# Patient Record
Sex: Female | Born: 1985
Health system: Southern US, Community
[De-identification: ages and names within clinical notes are randomized; demographics above are authoritative.]

## PROBLEM LIST (undated history)

## (undated) DIAGNOSIS — D649 Anemia, unspecified: Secondary | ICD-10-CM

## (undated) DIAGNOSIS — R569 Unspecified convulsions: Secondary | ICD-10-CM

## (undated) DIAGNOSIS — Z5189 Encounter for other specified aftercare: Secondary | ICD-10-CM

---
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
## 2007-05-09 ENCOUNTER — Emergency Department (HOSPITAL_COMMUNITY): Admission: EM | Admit: 2007-05-09 | Discharge: 2007-05-09 | Payer: Self-pay | Admitting: Emergency Medicine

## 2007-06-02 ENCOUNTER — Ambulatory Visit (HOSPITAL_COMMUNITY): Admission: RE | Admit: 2007-06-02 | Discharge: 2007-06-02 | Payer: Self-pay | Admitting: Family Medicine

## 2007-07-18 ENCOUNTER — Other Ambulatory Visit: Admission: RE | Admit: 2007-07-18 | Discharge: 2007-07-18 | Payer: Self-pay | Admitting: Obstetrics and Gynecology

## 2007-07-27 ENCOUNTER — Emergency Department (HOSPITAL_COMMUNITY): Admission: EM | Admit: 2007-07-27 | Discharge: 2007-07-28 | Payer: Self-pay | Admitting: Emergency Medicine

## 2007-07-31 ENCOUNTER — Ambulatory Visit (HOSPITAL_COMMUNITY): Admission: RE | Admit: 2007-07-31 | Discharge: 2007-07-31 | Payer: Self-pay | Admitting: Family Medicine

## 2007-10-16 ENCOUNTER — Other Ambulatory Visit: Admission: RE | Admit: 2007-10-16 | Discharge: 2007-10-16 | Payer: Self-pay | Admitting: Obstetrics and Gynecology

## 2008-05-05 ENCOUNTER — Emergency Department (HOSPITAL_COMMUNITY): Admission: EM | Admit: 2008-05-05 | Discharge: 2008-05-05 | Payer: Self-pay | Admitting: Emergency Medicine

## 2008-08-31 ENCOUNTER — Ambulatory Visit: Payer: Self-pay | Admitting: Cardiology

## 2008-08-31 ENCOUNTER — Ambulatory Visit: Payer: Self-pay | Admitting: Advanced Practice Midwife

## 2008-08-31 ENCOUNTER — Inpatient Hospital Stay (HOSPITAL_COMMUNITY): Admission: AD | Admit: 2008-08-31 | Discharge: 2008-08-31 | Payer: Self-pay | Admitting: Obstetrics & Gynecology

## 2008-08-31 ENCOUNTER — Inpatient Hospital Stay (HOSPITAL_COMMUNITY): Admission: AD | Admit: 2008-08-31 | Discharge: 2008-09-12 | Payer: Self-pay | Admitting: Obstetrics & Gynecology

## 2008-09-07 ENCOUNTER — Encounter: Payer: Self-pay | Admitting: Cardiology

## 2009-01-03 ENCOUNTER — Other Ambulatory Visit: Admission: RE | Admit: 2009-01-03 | Discharge: 2009-01-03 | Payer: Self-pay | Admitting: Obstetrics & Gynecology

## 2010-02-03 IMAGING — CR DG ABDOMEN ACUTE W/ 1V CHEST
3 series · 3 of 3 positions shown · non-contrast
Comparison: None

CLINICAL DATA: Postpartum.  Evaluate for ileus.

ACUTE ABDOMEN SERIES (ABDOMEN 2 VIEW & CHEST 1 VIEW)

[view not recorded (1 of 3)]
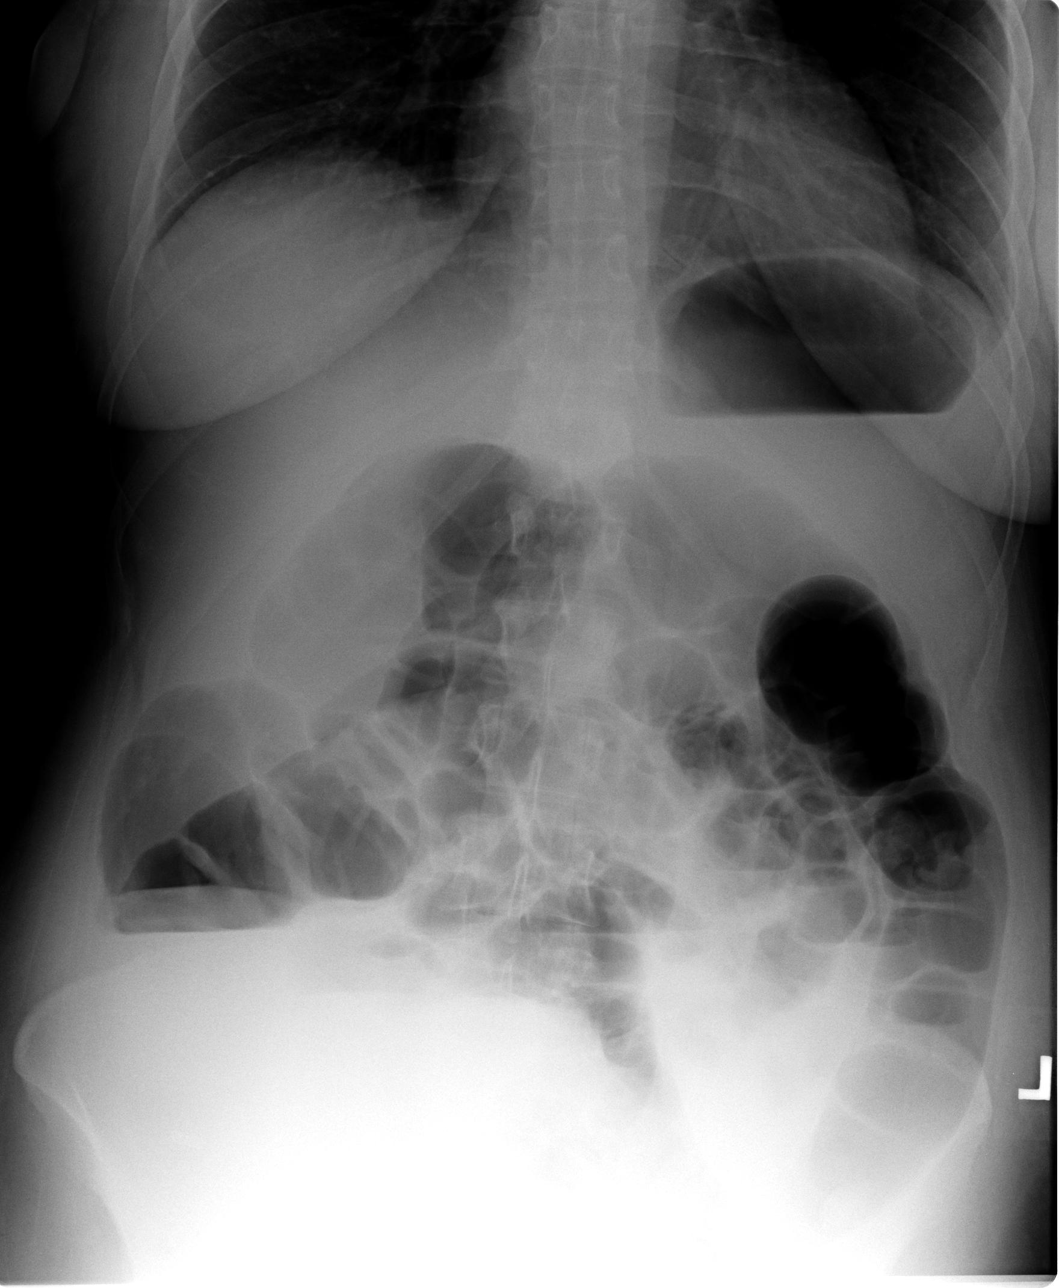

[view not recorded (2 of 3)]
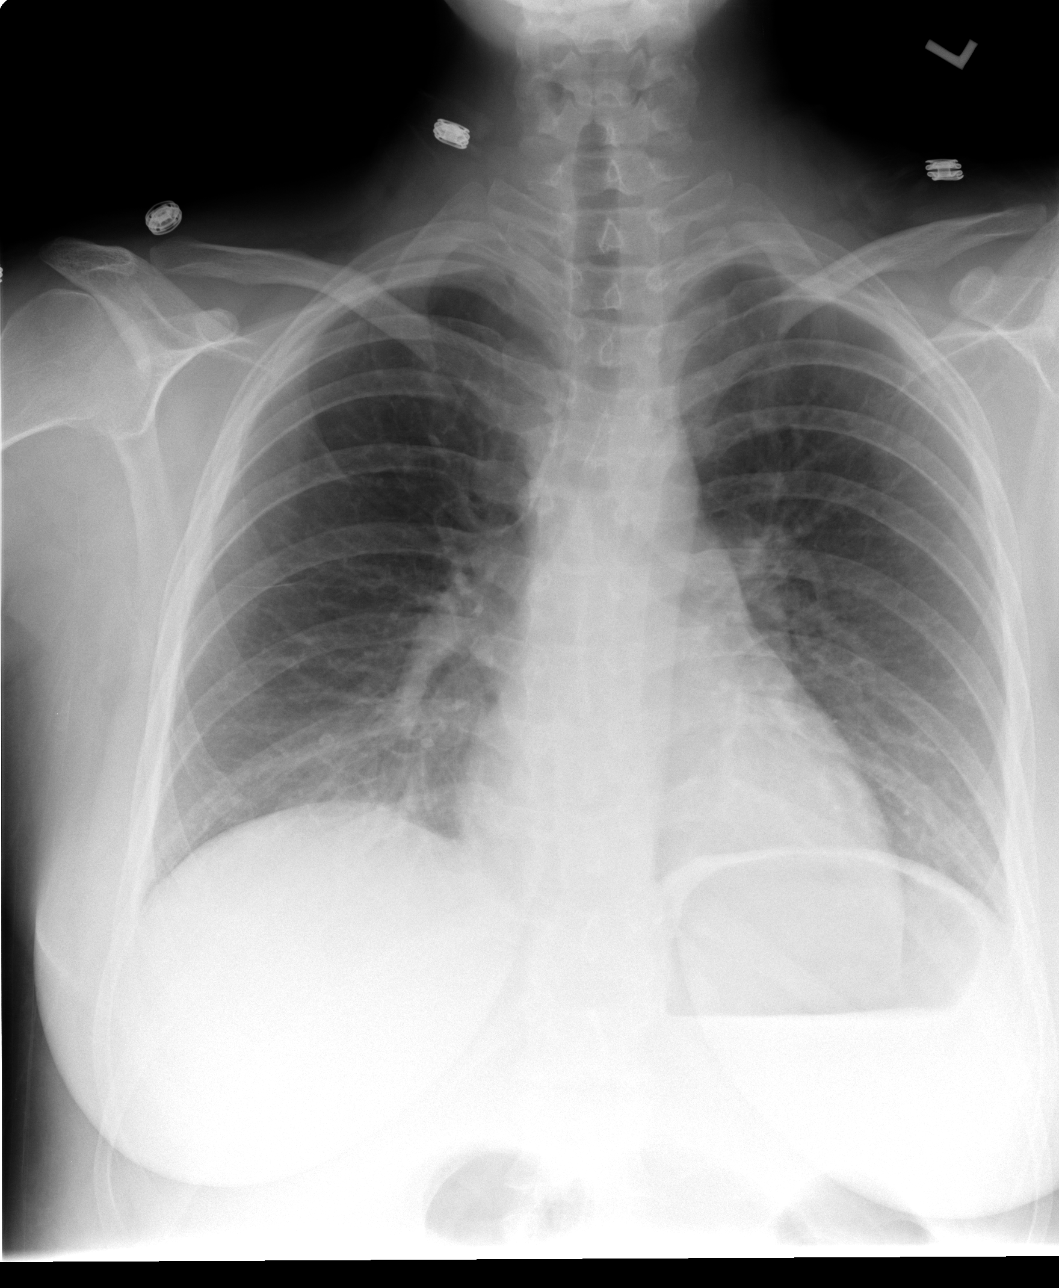

[view not recorded (3 of 3)]
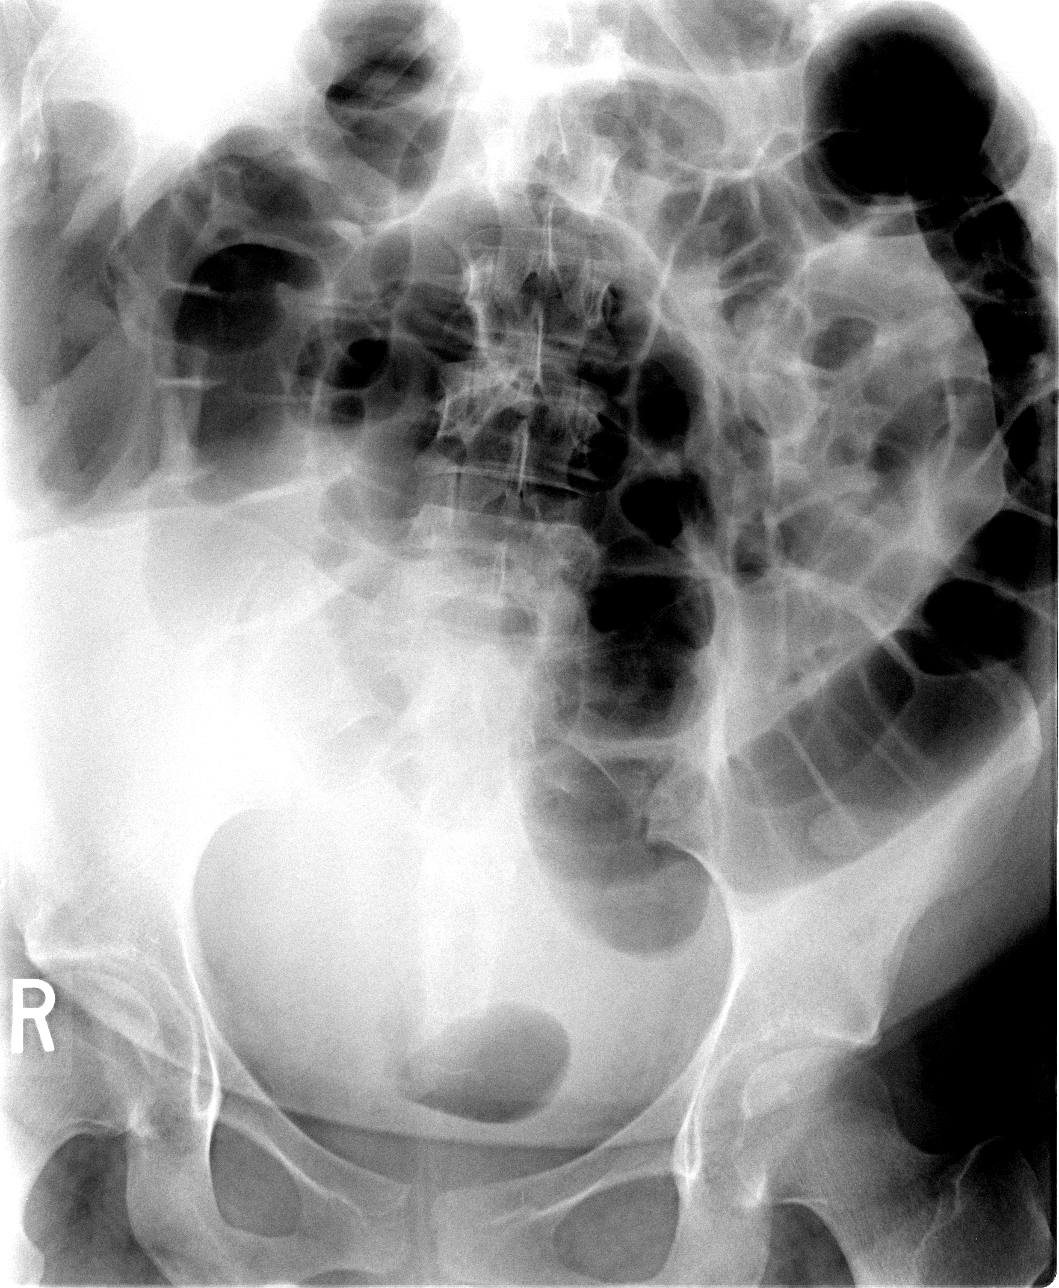

[3 of 3 positions shown; findings below may reference images not displayed]

FINDINGS: The lung volumes are slightly low bilaterally.  Heart
size is within normal limits.  Pulmonary vascularity appears
normal.  No airspace opacities, effusions or pneumothorax is
identified.  No evidence of pulmonary edema.

There is an air-fluid level in the stomach.  No free
intraperitoneal air is identified.  There is diffuse gaseous
distention of small bowel loops and colon.  Gas is seen to the
level of the distal sigmoid/rectum.  Increased soft tissue density
in the central pelvis, slightly more prominent to the right of
midline, is most likely related to the post-partum uterus.
IMPRESSION: 1. Diffuse gaseous distention of bowel loops is compatible with
ileus.
2.  No evidence of acute cardiopulmonary disease.

## 2010-05-11 ENCOUNTER — Ambulatory Visit (HOSPITAL_COMMUNITY): Admission: RE | Admit: 2010-05-11 | Discharge: 2010-05-11 | Payer: Self-pay | Admitting: Obstetrics & Gynecology

## 2010-05-31 ENCOUNTER — Inpatient Hospital Stay (HOSPITAL_COMMUNITY): Admission: AD | Admit: 2010-05-31 | Discharge: 2010-05-31 | Payer: Self-pay | Admitting: Obstetrics & Gynecology

## 2010-06-02 ENCOUNTER — Inpatient Hospital Stay (HOSPITAL_COMMUNITY): Admission: RE | Admit: 2010-06-02 | Discharge: 2010-06-05 | Payer: Medicaid Other | Admitting: Obstetrics and Gynecology

## 2010-06-27 ENCOUNTER — Emergency Department (HOSPITAL_COMMUNITY): Admission: EM | Admit: 2010-06-27 | Discharge: 2010-06-27 | Payer: Self-pay | Admitting: Emergency Medicine

## 2010-12-03 NOTE — Discharge Summary (Signed)
  [name], Laura Tran               ACCOUNT NO.:  [id number]  MEDICAL RECORD NO.:  [id number]          PATIENT TYPE:  INP  LOCATION:  9115                          FACILITY:  [facility]  PHYSICIAN:  Malva Limes, M.D.    DATE OF BIRTH:  01-01-1986  DATE OF ADMISSION:  06/02/2010 DATE OF DISCHARGE:  06/05/2010                              DISCHARGE SUMMARY   PRINCIPAL DISCHARGE DIAGNOSES: 1. Intrauterine pregnancy at term. 2. History of prior cesarean section. 3. Patient desired repeat cesarean section. 4. Anemia.  PRINCIPAL PROCEDURE:  Repeat low transverse cesarean section.  HISTORY OF PRESENT ILLNESS:  [name] 25 year old female G2, P1-0-0-1 at 12 weeks estimated gestational age, who expressed her desire for repeat cesarean section.  The patient's prenatal care was uncomplicated.  HOSPITAL COURSE:  The patient underwent repeat cesarean section, performed by Dr. Carrington Clamp.  A complete description of this can be found as dictated in operative note.  The patient delivered one live viable female infant, Apgars 8 at 1 minute and 8 at 5 minutes.  Weight 7 pounds 3 ounces.  The patient had normal uterine anatomy.  The patient's preop hemoglobin was 11.2, postop 7.2, and repeated just prior to discharge was 7.8.  The patient's postop course was uneventful.  She was ambulating at the time of discharge and eating a regular diet.  She remained afebrile.  Her incision appeared to be healing well.  The patient was discharged to home on postop day #3.  She was sent home with Percocet and iron.  She was instructed to follow up in the office in 4 weeks.          ______________________________ Malva Limes, M.D.     MA/MEDQ  D:  11/17/2010  T:  11/18/2010  Job:  [id number]  Electronically Signed by Malva Limes M.D. on 12/03/2010 12:47:00 PM

## 2010-12-25 LAB — CBC
HCT: 23 % — ABNORMAL LOW (ref 36.0–46.0)
Hemoglobin: 11.2 g/dL — ABNORMAL LOW (ref 12.0–15.0)
Hemoglobin: 7.8 g/dL — ABNORMAL LOW (ref 12.0–15.0)
MCH: 30.7 pg (ref 26.0–34.0)
MCHC: 33.8 g/dL (ref 30.0–36.0)
MCHC: 34.6 g/dL (ref 30.0–36.0)
MCV: 88.8 fL (ref 78.0–100.0)
MCV: 89.2 fL (ref 78.0–100.0)
MCV: 89.5 fL (ref 78.0–100.0)
Platelets: 238 10*3/uL (ref 150–400)
RBC: 2.33 MIL/uL — ABNORMAL LOW (ref 3.87–5.11)
RBC: 2.57 MIL/uL — ABNORMAL LOW (ref 3.87–5.11)
RDW: 14.2 % (ref 11.5–15.5)
RDW: 14.6 % (ref 11.5–15.5)
RDW: 14.8 % (ref 11.5–15.5)
WBC: 7.9 10*3/uL (ref 4.0–10.5)

## 2010-12-25 LAB — RPR: RPR Ser Ql: NONREACTIVE

## 2010-12-25 LAB — RH IMMUNE GLOB WKUP(>/=20WKS)(NOT WOMEN'S HOSP): Fetal Screen: NEGATIVE

## 2011-02-23 NOTE — H&P (Signed)
Laura Tran, Laura Tran             ACCOUNT NO.:  [id number]   MEDICAL RECORD NO.:  [id number]          PATIENT TYPE:  AMB   LOCATION:                                [facility]   PHYSICIAN:  Tilda Burrow, M.D. DATE OF BIRTH:  1986-07-17   DATE OF ADMISSION:  08/31/2008  DATE OF DISCHARGE:                              HISTORY & PHYSICAL   ADMITTING DIAGNOSES:  1. Pregnancy at 41-plus 0 weeks' gestation.  2. Postdates pregnancy.  3. Seizure disorder, currently on Keppra 250 mg b.i.d.   HISTORY OF PRESENT ILLNESS:  This 25 year old female gravida 1, para 0,  LMP November 18, 2007, placing menstrual EDC of August 25, 2008 with 5-  week ultrasound on December 27, 2007 corresponding exactly with [facility] of  August 25, 2008, 9-week ultrasound suggesting August 23, 2008, and  20-week ultrasound suggesting August 21, 2008, admitted at 41.0 weeks'  gestation for induction of labor.  The presenting part is well engaged  in the cervix, but the cervix remains posterior.  Foley bulb cervical  ripening may be a consideration or Cytotec.  The patient is scheduled  for induction due to postdates.  I strongly supported that plan.  The  patient is aware that all the usual complications of labor induction,  labor clinic and delivery clinic will induce deliveries including fetal  distress and need for emergency delivery.   PAST MEDICAL HISTORY:  Positive for idiopathic primary generalized  epilepsy.  She was first seen in January 2009.  Her initial  consultation with [facility] was August 08, 2007,  where she had a single witnessed seizure, and EEG confirmed a highly  epileptiform EKG.  She was initially treated with Keppra 500 mg twice a  day, and it was reduced to 250 mg b.i.d. during the pregnancy.  She has  had no seizures during the pregnancy.  She has also had no signs and  symptoms of preeclampsia.   PAST SURGICAL HISTORY:  Exploratory laparotomy and neck  surgery.   ALLERGIES:  AMOXICILLIN and PENICILLIN.   MEDICATIONS:  Prenatal vitamins and Keppra 250 mg b.i.d., managed by Dr.  Pearlean Brownie, [facility].   PRENATAL LABS:  Blood type B negative, Rhophylac received at 31 weeks,  RPR nonreactive, and rubella immune.  Hemoglobin 12, hematocrit 36.  Hepatitis, HIV, RPR, GC, and chlamydia all negative.  Pap smear class I.  Group B strep negative.  Glucose tolerance test 106 mg percent.  She  plans epidural, breast-feeding, desires Depo-Provera postpartum  contraception.   PHYSICAL EXAMINATION:  GENERAL:  Tall Caucasian female, weight 189.6,  blood pressure 124/78.  HEENT:  Pupils equal round and reactive.  NECK:  Supple.  CHEST:  Clear to auscultation.  ABDOMEN:  Nontender, 38 cm fundal height.  Vertex well applied to the  cervix.  The cervix remained posterior, closed, 75%, -2 station.   PLAN:  Admit Sunday evening at 7:30 p.m. for probable Foley bulb  cervical ripening and subsequent Pitocin induction of labor.      Tilda Burrow, M.D.  Electronically Signed     JVF/MEDQ  D:  08/27/2008  T:  08/28/2008  Job:  [id number]

## 2011-02-23 NOTE — Op Note (Signed)
NAMEMarland Kitchen  Laura Tran, Laura Tran      ACCOUNT NO.:  [id number]   MEDICAL RECORD NO.:  [id number]          PATIENT TYPE:  INP   LOCATION:  9103                          [facility]ILITY:  [facility]   PHYSICIAN:  Allie Bossier, MD        DATE OF BIRTH:  10-May-1986   DATE OF PROCEDURE:  DATE OF DISCHARGE:                               OPERATIVE REPORT   PREOPERATIVE DIAGNOSES:  1. Cephalopelvic disproportion.  2. Obesity.   POSTOPERATIVE DIAGNOSES:  1. Cephalopelvic disproportion.  2. Obesity.  3. Macrosomia.   PROCEDURE:  Primary low transverse cesarean section.   SURGEON:  Allie Bossier, MD   ANESTHESIA:  Epidural by Octaviano Glow. Pamalee Leyden, MD   COMPLICATIONS:  None.   ESTIMATED BLOOD LOSS:  800 mL.   SPECIMENS:  Cord blood.   FINDINGS:  1. Living female infant, weight 9 pounds and 11 ounces, Apgars 9 and      11.  2. Normal adnexa.   DETAIL PROCEDURE AND FINDINGS:  The risks, benefits, and alternatives of  the surgery were explained, understood and accepted, consents were  signed.  She was taken to the operating room and her epidural was  bolused for surgery.  The abdomen was prepped and draped in the usual  sterile fashion.  After adequate anesthesia was assured, a transverse  incision was made approximately 2 cm above the symphysis pubis.  Incision was carried down through the subcutaneous tissue to the fascia.  Bleeding encountered was cauterized with the Bovie.  Fascial incision  was extended bilaterally and curved slightly upwards.  The pyramidalis  and middle one fourth of the rectus muscles were separated in the  midline and gently pulled apart.  The peritoneum was entered with  hemostats.  The peritoneal incision was extended with the Bovie  bilaterally taking care to avoid the bladder.  Bladder blade was placed.  A transverse incision was made on the very well-developed lower uterine  segment.  Amniotomy was performed with hemostats and clear fluid was  noted.  The uterine incision  was extended with traction bilaterally.  The baby's head was very low in the pelvis and was gently delivered out  of the uterus.  Her mouth and nostrils were suctioned prior to delivery  of the shoulders.  Cord was then clamped and cut, and the baby was  transferred to the NICU personnel for routine care.  Her weight and  Apgars are as listed above.  Cord blood sample was obtained.  The  placenta was extracted manually and noted to be intact.  The uterine  anterior was cleaned with dry lap sponge.  A bladder blade was placed.  The uterine incision was closed with 1 layer of 0 chromic running  locking suture.  The second layer imbricating the first.  Excellent  hemostasis was noted.  By tilting the uterus, I was able to visualize  the adnexa.  There were no cysts, but these ovaries did have an  appearance consistent with polycystic ovarian syndrome.  The uterine  incision was again inspected as was the rectus muscles on the rectus  fascia.  No bleeding was noted.  The  fascia was closed with 0 Vicryl  running nonlocking suture.  No defects were palpable.  The subcutaneous  tissue was irrigated, cleaned, and dried.  It was then infiltrated with  20 mL of 0.5% Marcaine.  A subcuticular closure was done with 3-0 Vicryl  suture.  Steri-Strips were placed.  A Foley catheter was draining very  constipated urine at beginning of the case and by at the time of the  delivery of the head or just after delivery of the head, the urine had a  pink tinged.  Near the end of the case, the urine was beginning to  clear.  The instruments, sponge, and needle counts were correct.  She  tolerated the procedure well.  She was taken to the recovery room in  stable condition.      Allie Bossier, MD  Electronically Signed     MCD/MEDQ  D:  09/01/2008  T:  09/01/2008  Job:  [id number]

## 2011-02-23 NOTE — Discharge Summary (Signed)
Laura Tran, Laura Tran      ACCOUNT NO.:  [id number]   MEDICAL RECORD NO.:  [id number]          PATIENT TYPE:  INP   LOCATION:  9304                          [facility]   PHYSICIAN:  Norton Blizzard, MD    DATE OF BIRTH:  Jul 05, 1986   DATE OF ADMISSION:  08/31/2008  DATE OF DISCHARGE:                               DISCHARGE SUMMARY   DISCHARGE DIAGNOSES:  1. Intrauterine pregnancy at 31 and 6/7 weeks' gestational age.  2. Status post primary low-transverse cesarean section for      cephalopelvic disproportion.  3. Status post transfusion of 4 units of packed red blood cells.  4. Postoperative ileus.  5. Urinary tract infection.  6. History of seizure disorder.   PROCEDURE PERFORMED:  1. Primary low-transverse cesarean section.  2. Transfusion of 4 units of packed red blood cells.  3. Nasogastric tube insertion.   REASON FOR ADMISSION:  [name] is a 25 year old gravida 1, now para  1 who was admitted on August 31, 2008, with spontaneous onset of labor  at 64 and 6/7 weeks' gestational age.   HOSPITAL COURSE:  The patient was admitted to Labor and Delivery and the  first stage of labor progressed in the usual manner.  However, the  second stage of labor after 2 hours of pushing, baby was noted to have  no descent past +1 station.  After a lengthy discussion with the patient  including the indications for cesarean section as well as the risks and  benefits, the patient decided to proceed with cesarean section, which  was performed for cephalopelvic disproportion.  Please see Dr. Ellin Saba  operative report for details of the cesarean section.  Cesarean section  was performed on September 01, 2008, in the evening, which yielded a  viable 9 pound 11 ounce female infant and had an estimated blood loss of  800 mL.  Postoperatively, she was found to have several episodes of  heavy vaginal bleeding with some clots noted.  Her initial postoperative  hematocrit was 7.5 and  on repeat she was found to be 6.2.  She was then  transfused 2 units of packed red blood cells on September 03, 2008.  After this transfusion, her hemoglobin was noted to be 7.8.  Additionally, the patient was noted to have an intermittent tachycardia.  On September 04, 2008, her resting pulse was noted to be 125.  Orthostatics were performed and found that her pulse went up to 154 when  she was standing.  Because of this increased pulse, decision was made to  transfuse additional 2 units of packed red blood cells.  Later in the  evening on September 04, 2008, the patient had 300 mL episode of bilious-  colored emesis.  Of note, over the last preceding day or 2 the patient's  abdomen becoming more and more distended.  The patient was experiencing  some nausea.  She was diagnosed with a postoperative ileus based on  clinical exam as well as an x-ray showing some mild dilation of her  loops of small bowel.  On September 06, 2008, an additional further  episodes of bilious vomiting resulted in  placement of an nasogastric  tube.  On September 07, 2008, the patient was known to be febrile as well  as tachycardic once again.  Blood cultures and urine cultures as well as  chest x-ray ordered and the patient was transferred to the Intensive  Care Unit for closer monitoring.  On the afternoon of September 07, 2008,  the patient was noted to have several loose bowel movements that were  foul-smelling and these were concerned for C. diff.  The patient was  started on Flagyl.  Because of her persistent tachycardia, the patient  was evaluated by Cardiology.  She had a normal cardiac echo and normal  EKGs and cardiology's opinion was the patient had sinus tachycardia.  Repeat CT scan showed some fluid diffusely in the abdomen and because of  her fevers we began to empirically treat for endometritis.  A spiral CT  was also performed to rule out pulmonary embolism, which was also  negative.  On September 09, 2008, the results of her urinalysis, urine  culture revealed an E. coli strain that was resistant to ampicillin,  Ancef, as well as gentamicin.  This explained her fevers and her lack of  response to antibiotics since her antibiotics included ampicillin and  gentamicin.  At this time she was changed to Cipro.  She was started on  Cipro IV and after few days changed to p.o. and she was tolerating  p.o.'s better.  Additionally, on September 09, 2008, she was found to be  clinically improving as her abdomen was softer and less distended.  On  September 10, 2008, she was noted to be finally passing some gas and had a  bowel movement, and her ambulation was improving, started her on a clear  diet, which was advanced as tolerated and on September 11, 2008, she  started to eat solid foods.  On September 12, 2008, at the time of  discharge, she is markedly improved.  Her abdomen is not distended at  all.  She has active bowel sounds throughout her abdomen.  Her heart  rate is 71.  She is tolerating a full diet to include chicken tenders  from [employer] that she tolerated without any nausea.  She is passing  gas, she is having bowel movements, and the patient is ready to go home.  She will be discharged on a continued course of Flagyl as well as Cipro  in addition to standard pain medications.  She is a patient of [facility] and will follow up with Dr. Emelda Fear within 1 week for evaluation  of her abdomen and surgical site.   DISCHARGE MEDICATIONS:  1. Percocet 1-2 every 6 hours as needed for severe pain 40.  2. Motrin 600 mg 1 every 6 hours as needed for pain, 60 given.  3. Ciprofloxacin 500 mg 1 p.o. twice a day for another 10 days to      complete 14-day course.  4. Flagyl 500 mg 1 p.o. 3 times daily to complete a 10-day course.   INSTRUCTION TO PATIENT:  The patient was given routine postpartum C-  section instructions.  She was given instructions on taking her  medication and given  instructions on her follow up with Dr. Emelda Fear.  The patient voiced understanding of these instructions and her questions  were answered.      Odie Sera, DO  Electronically Signed     ______________________________  Norton Blizzard, MD    MC/MEDQ  D:  09/12/2008  T:  09/12/2008  Job:  [id number]

## 2011-02-23 NOTE — Consult Note (Signed)
NAMEMarland Tran  CHANIN, FRUMKIN      ACCOUNT NO.:  [id number]   MEDICAL RECORD NO.:  [id number]          PATIENT TYPE:  INP   LOCATION:  9373                          [facility]   PHYSICIAN:  Jonelle Sidle, MD DATE OF BIRTH:  1985-10-28   DATE OF CONSULTATION:  09/08/2008  DATE OF DISCHARGE:                                 CONSULTATION   REASON FOR CONSULTATION:  Tachycardia.   HISTORY OF PRESENT ILLNESS:  [name] is a 25 year old woman  with known seizure disorder treated with Keppra and no antecedent  history of cardiovascular disease or dysrhythmia.  She was admitted to  the hospital on November 21 at 41+[redacted] weeks gestation and ultimately  underwent a primary low-transverse Cesarean section due to cephalopelvic  disproportion.  She had 800 mL estimated blood loss and delivered a 9  pound 11 ounce female infant.  She has been observed in the hospital  since that time and has been noted to be fairly tachycardic throughout.  In reviewing the available data, I see that her heart rate has  essentially been over 100 beats per minute the entire time, typically in  the 120 to 130 range.  With activity, this may increase to the 160s.  Complicating this has been recurrent fevers and a drop of hemoglobin  down to 7.3 requiring packed red blood cell transfusions.  She has  undergone imaging of the abdomen and pelvis as of November 28  demonstrating dilatation of the small bowel consistent with ileus, which  is also the patient's clinical presentation at this time requiring NG  tube placement and suction.  She has been n.p.o. since Wednesday.  There  is evidence of a partial small bowel obstruction and also fairly diffuse  abdominal ascites without focal abscess or free air.  A small  pericardial effusion was identified.  [facility] was actually  contacted about this patient yesterday.  Recommendations were to obtain  a 2D echocardiogram.  This study was interpreted by  Dr. Patty Sermons to  reveal normal left ventricular systolic function at 60% without regional  wall motion abnormalities, mild mitral regurgitation was noted with  upper normal left atrial size, and a small pericardial effusion was  identified circumferential to the heart.  Their service was contacted  again today with recommendation to proceed with a CT scan of the chest  and this revealed no evidence of pulmonary embolism or thoracic aortic  dissection.  Small bilateral pleural effusions and abdominal ascites  were noted.  We were ultimately contacted to see this patient from a  clinical perspective.   In reviewing the available electrocardiogram from November 24, the  patient has evidence of sinus tachycardia but overall normal intervals.  PR interval was 180 ms with QRS duration of 76 ms and normal QT  interval.  She has not been on telemetry.  Vital signs show heart rates  ranging from 127 to 160 beats per minute, and she has had fever spikes  up to 103.3 degrees as of yesterday.  I do see a single set of  orthostatic vital signs from November 25, which showed no significant  orthostatic change from supine  to standing position from the perspective  of systolic blood pressure, although her heart rate increased from 125  up to 154 beats per minute.  At this point, she feels fatigued, but  denies any active chest pain.  She has mild abdominal discomfort.  We  have been asked to evaluate her further.   ALLERGIES:  Listed as PENICILLIN, AMOXICILLIN, and LAMICTAL.   PRESENT MEDICATIONS:  1. Tylenol.  2. Ancef 1 g IV q.8 hours.  3. Colace 100 mg p.o. b.i.d.  4. Ferrous sulfate 325 mg p.o. b.i.d.  5. Gentamicin 170 mg IV q.8 hours.  6. Keppra 250 mg p.o. b.i.d.  7. Protonix 40 mg IV daily.  8. Prenatal vitamin 1 daily.  9. Flagyl 500 mg IV q.6 hours.  10.P.r.n. Zofran.  11.Percocet.  12.Nubain.  13.Ambien.   PAST MEDICAL HISTORY:  As outlined above.  She has a diagnosis of   idiopathic primary generalized epilepsy and it is followed by Dr. Pearlean Brownie.  No prior history of preeclampsia.  No described history of hypertension  or diabetes mellitus.  She does have a prior history of exploratory  laparotomy and cervical surgery.   FAMILY HISTORY:  Reviewed, significant for hypertension.  No obvious  dysrhythmia or premature cardiovascular disease.   SOCIAL HISTORY:  The patient denies tobacco or alcohol use.   REVIEW OF SYSTEMS:  As outlined above.  At baseline, the patient denies  any sense of palpitations, dizziness, or syncope.  No typical functional  limitations.   EXAMINATION:  VITAL SIGNS:  Heart rate in the 130s.  Most recent  temperature 100.4 degrees.  Respirations 21.  Blood pressure 121/83.  Weight is 86 kg.  CONSTITUTIONAL:  The patient is in no acute distress.  NG tube in place  to suction.  HEENT:  Conjunctivae and lids normal.  Oropharynx clear.  NECK:  Supple.  No elevated jugular venous pressure.  No loud bruits.  No thyromegaly.  LUNGS:  Essentially clear.  Diminished breath sounds at the bases.  No  wheezing noted.  CARDIAC:  Reveals a regular rate and rhythm.  No pericardial rub is  evident.  No pathologic systolic murmur.  No clear S3 gallop at rapid  rate.  ABDOMEN:  Somewhat distended.  No guarding noted.  Bowel sounds are  significantly diminished.  EXTREMITIES:  Exhibit 1+ edema, symmetric.  Distal pulses 1+.  SKIN:  Warm and dry.  MUSCULOSKELETAL:  No kyphosis noted.  NEURO/PSYCH:  The patient is alert and oriented x3.  Affect is somewhat  flat.   LABORATORY DATA:  WBCs 11.7, hemoglobin 9.9, hematocrit 29.7, platelets  486,000.  Sodium 137, potassium 3.6, chloride 106, bicarb 26, glucose  110, BUN 2, creatinine 0.48.  Albumin is 1.3.  TSH is 0.9.  Fecal occult  is negative.  Recent urinalysis shows pH 8.5, specific gravity 1.01,  small amount of blood.  Blood cultures negative to date.  Urinalysis  growing greater than 100,000  colonies per mL of Escherichia coli.  Sensitivities pending.   IMPRESSION:  Apparent sinus tachycardia, likely secondary rather than  related to primary cardiac etiology.  Normal left ventricular systolic  function has been documented by recent echocardiography.  No clear  evidence of pulmonary embolism based on recent CT scan of the chest.  With recent fever spikes, Cesarean section, and ileus, would agree with  antibiotics and searching for other underlying causes, possibly  intraabdominal.  She has also had some diarrhea recently with some  thickening within the colon,  raising the possibility of clostridium  difficile colitis.  I do not have an old electrocardiogram for  comparison.  The likelihood of a sustained supraventricular tachycardia  other than sinus is quite low.  The patient's heart rate seems to have  been elevated fairly consistently over the last several days without any  abrupt onset or offset tachycardia.  Recent TSH is in the low normal  range.   RECOMMENDATIONS:  Would continue to investigate and treat other  underlying causes for sinus tachycardia.  I would like the patient to be  placed on telemetry so that we can better follow her heart rate trend.  A follow up electrocardiogram will be obtained in the morning, as well  as a CBC and BMET.  Would repeat orthostatic measurements as well.  Our  service will plan to follow with you.      Jonelle Sidle, MD  Electronically Signed     SGM/MEDQ  D:  09/08/2008  T:  09/08/2008  Job:  [id number]   cc:   Pramod P. Pearlean Brownie, MD  Fax: [phone]

## 2011-07-09 LAB — URINALYSIS, ROUTINE W REFLEX MICROSCOPIC
Glucose, UA: NEGATIVE
Ketones, ur: NEGATIVE
Specific Gravity, Urine: 1.02
Urobilinogen, UA: 0.2

## 2011-07-13 LAB — COMPREHENSIVE METABOLIC PANEL
ALT: <8 U/L (ref 0–35)
AST: 10 U/L (ref 0–37)
CO2: 26 mEq/L (ref 19–32)
Calcium: 7.7 mg/dL — ABNORMAL LOW (ref 8.4–10.5)
Chloride: 106 mEq/L (ref 96–112)
Creatinine, Ser: 0.48 mg/dL (ref 0.4–1.2)
GFR calc Af Amer: >60 mL/min (ref >60)
GFR calc non Af Amer: >60 mL/min (ref >60)
Glucose, Bld: 110 mg/dL — ABNORMAL HIGH (ref 70–99)
Sodium: 137 mEq/L (ref 135–145)
Total Bilirubin: 0.4 mg/dL (ref 0.3–1.2)

## 2011-07-13 LAB — URINALYSIS, ROUTINE W REFLEX MICROSCOPIC
Glucose, UA: NEGATIVE mg/dL
Ketones, ur: >80 mg/dL — AB
Ketones, ur: NEGATIVE mg/dL
Leukocytes, UA: NEGATIVE
Leukocytes, UA: NEGATIVE
Nitrite: NEGATIVE
Protein, ur: 100 mg/dL — AB
Protein, ur: NEGATIVE mg/dL
Specific Gravity, Urine: 1.015 (ref 1.005–1.030)
Urobilinogen, UA: 0.2 mg/dL (ref 0.0–1.0)
Urobilinogen, UA: 0.2 mg/dL (ref 0.0–1.0)
Urobilinogen, UA: 0.2 mg/dL (ref 0.0–1.0)
pH: 7 (ref 5.0–8.0)

## 2011-07-13 LAB — CBC
HCT: 18.6 % — ABNORMAL LOW (ref 36.0–46.0)
HCT: 28.7 % — ABNORMAL LOW (ref 36.0–46.0)
HCT: 30.8 % — ABNORMAL LOW (ref 36.0–46.0)
Hemoglobin: 10.5 g/dL — ABNORMAL LOW (ref 12.0–15.0)
Hemoglobin: 7 g/dL — CL (ref 12.0–15.0)
Hemoglobin: 7.8 g/dL — CL (ref 12.0–15.0)
Hemoglobin: 9.9 g/dL — ABNORMAL LOW (ref 12.0–15.0)
MCHC: 33.2 g/dL (ref 30.0–36.0)
MCHC: 33.5 g/dL (ref 30.0–36.0)
MCHC: 33.5 g/dL (ref 30.0–36.0)
MCHC: 33.7 g/dL (ref 30.0–36.0)
MCHC: 34 g/dL (ref 30.0–36.0)
MCV: 88.2 fL (ref 78.0–100.0)
MCV: 88.2 fL (ref 78.0–100.0)
MCV: 88.3 fL (ref 78.0–100.0)
MCV: 88.4 fL (ref 78.0–100.0)
MCV: 88.9 fL (ref 78.0–100.0)
MCV: 88.9 fL (ref 78.0–100.0)
MCV: 89.8 fL (ref 78.0–100.0)
MCV: 90.5 fL (ref 78.0–100.0)
Platelets: 212 10*3/uL (ref 150–400)
Platelets: 234 10*3/uL (ref 150–400)
Platelets: 239 10*3/uL (ref 150–400)
Platelets: 418 10*3/uL — ABNORMAL HIGH (ref 150–400)
Platelets: 436 10*3/uL — ABNORMAL HIGH (ref 150–400)
RBC: 2.06 MIL/uL — ABNORMAL LOW (ref 3.87–5.11)
RBC: 2.32 MIL/uL — ABNORMAL LOW (ref 3.87–5.11)
RBC: 2.46 MIL/uL — ABNORMAL LOW (ref 3.87–5.11)
RBC: 2.58 MIL/uL — ABNORMAL LOW (ref 3.87–5.11)
RBC: 3.34 MIL/uL — ABNORMAL LOW (ref 3.87–5.11)
RBC: 3.46 MIL/uL — ABNORMAL LOW (ref 3.87–5.11)
RBC: 3.48 MIL/uL — ABNORMAL LOW (ref 3.87–5.11)
RDW: 15.3 % (ref 11.5–15.5)
RDW: 16.3 % — ABNORMAL HIGH (ref 11.5–15.5)
WBC: 11.7 10*3/uL — ABNORMAL HIGH (ref 4.0–10.5)
WBC: 11.9 10*3/uL — ABNORMAL HIGH (ref 4.0–10.5)
WBC: 12.3 10*3/uL — ABNORMAL HIGH (ref 4.0–10.5)
WBC: 13.2 10*3/uL — ABNORMAL HIGH (ref 4.0–10.5)
WBC: 13.2 10*3/uL — ABNORMAL HIGH (ref 4.0–10.5)
WBC: 16.1 10*3/uL — ABNORMAL HIGH (ref 4.0–10.5)
WBC: 22.9 10*3/uL — ABNORMAL HIGH (ref 4.0–10.5)

## 2011-07-13 LAB — CROSSMATCH
ABO/RH(D): B NEG
ABO/RH(D): B NEG
DAT, IgG: NEGATIVE

## 2011-07-13 LAB — BASIC METABOLIC PANEL
BUN: 4 mg/dL — ABNORMAL LOW (ref 6–23)
CO2: 26 mEq/L (ref 19–32)
Calcium: 7.8 mg/dL — ABNORMAL LOW (ref 8.4–10.5)
Calcium: 7.9 mg/dL — ABNORMAL LOW (ref 8.4–10.5)
Chloride: 103 mEq/L (ref 96–112)
Chloride: 104 mEq/L (ref 96–112)
Chloride: 108 mEq/L (ref 96–112)
Creatinine, Ser: 0.51 mg/dL (ref 0.4–1.2)
Creatinine, Ser: 0.61 mg/dL (ref 0.4–1.2)
GFR calc Af Amer: >60 mL/min (ref >60)
GFR calc Af Amer: >60 mL/min (ref >60)
GFR calc Af Amer: >60 mL/min (ref >60)
GFR calc Af Amer: >60 mL/min (ref >60)
GFR calc non Af Amer: >60 mL/min (ref >60)
Glucose, Bld: 107 mg/dL — ABNORMAL HIGH (ref 70–99)
Potassium: 3.3 mEq/L — ABNORMAL LOW (ref 3.5–5.1)
Potassium: 3.9 mEq/L (ref 3.5–5.1)
Sodium: 136 mEq/L (ref 135–145)
Sodium: 140 mEq/L (ref 135–145)

## 2011-07-13 LAB — URINE MICROSCOPIC-ADD ON

## 2011-07-13 LAB — DIFFERENTIAL
Basophils Absolute: 0 10*3/uL (ref 0.0–0.1)
Basophils Absolute: 0 10*3/uL (ref 0.0–0.1)
Eosinophils Absolute: 0 10*3/uL (ref 0.0–0.7)
Eosinophils Absolute: 0 10*3/uL (ref 0.0–0.7)
Eosinophils Absolute: 0 10*3/uL (ref 0.0–0.7)
Eosinophils Relative: 0 % (ref 0–5)
Eosinophils Relative: 0 % (ref 0–5)
Eosinophils Relative: 0 % (ref 0–5)
Lymphocytes Relative: 6 % — ABNORMAL LOW (ref 12–46)
Lymphs Abs: 0.7 10*3/uL (ref 0.7–4.0)
Lymphs Abs: 0.8 10*3/uL (ref 0.7–4.0)
Monocytes Relative: 7 % (ref 3–12)
Neutrophils Relative %: 85 % — ABNORMAL HIGH (ref 43–77)

## 2011-07-13 LAB — RPR: RPR Ser Ql: NONREACTIVE

## 2011-07-13 LAB — OCCULT BLOOD X 1 CARD TO LAB, STOOL: Fecal Occult Bld: NEGATIVE

## 2011-07-13 LAB — CULTURE, BLOOD (ROUTINE X 2)

## 2011-07-13 LAB — TSH: TSH: 0.902 u[IU]/mL (ref 0.350–4.500)

## 2011-07-13 LAB — RH IMMUNE GLOB WKUP(>/=20WKS)(NOT WOMEN'S HOSP): Fetal Screen: NEGATIVE

## 2011-07-13 LAB — URINE CULTURE
Colony Count: >=100000
Special Requests: NEGATIVE

## 2011-07-16 LAB — CBC
HCT: 29.6 % — ABNORMAL LOW (ref 36.0–46.0)
Hemoglobin: 10 g/dL — ABNORMAL LOW (ref 12.0–15.0)
MCHC: 33.8 g/dL (ref 30.0–36.0)
RBC: 3.32 MIL/uL — ABNORMAL LOW (ref 3.87–5.11)

## 2011-07-16 LAB — BASIC METABOLIC PANEL
CO2: 28 mEq/L (ref 19–32)
Calcium: 8.4 mg/dL (ref 8.4–10.5)
Chloride: 105 mEq/L (ref 96–112)
GFR calc Af Amer: >60 mL/min (ref >60)
Potassium: 4.2 mEq/L (ref 3.5–5.1)
Sodium: 139 mEq/L (ref 135–145)

## 2011-07-21 LAB — CBC
HCT: 32.2 — ABNORMAL LOW
Hemoglobin: 11 — ABNORMAL LOW
MCHC: 34.2
RDW: 13.1

## 2011-07-21 LAB — URINALYSIS, ROUTINE W REFLEX MICROSCOPIC
Bilirubin Urine: NEGATIVE
Protein, ur: 30 — AB
Urobilinogen, UA: 0.2

## 2011-07-21 LAB — BASIC METABOLIC PANEL
BUN: 4 — ABNORMAL LOW
CO2: 25
Calcium: 8.8
Glucose, Bld: 106 — ABNORMAL HIGH
Potassium: 3.5
Sodium: 140

## 2011-07-21 LAB — DIFFERENTIAL
Basophils Absolute: 0
Basophils Relative: 0
Eosinophils Relative: 0
Monocytes Absolute: 0.7

## 2011-07-26 LAB — PREGNANCY, URINE: Preg Test, Ur: NEGATIVE

## 2011-12-25 ENCOUNTER — Emergency Department (HOSPITAL_COMMUNITY): Payer: Medicaid Other

## 2011-12-25 ENCOUNTER — Emergency Department (HOSPITAL_COMMUNITY)
Admission: EM | Admit: 2011-12-25 | Discharge: 2011-12-26 | Disposition: A | Discharge status: Home / Self Care | Payer: Medicaid Other | Attending: Emergency Medicine | Admitting: Emergency Medicine

## 2011-12-25 ENCOUNTER — Encounter (HOSPITAL_COMMUNITY): Payer: Self-pay | Admitting: *Deleted

## 2011-12-25 DIAGNOSIS — D279 Benign neoplasm of unspecified ovary: Secondary | ICD-10-CM | POA: Insufficient documentation

## 2011-12-25 DIAGNOSIS — R569 Unspecified convulsions: Secondary | ICD-10-CM | POA: Insufficient documentation

## 2011-12-25 DIAGNOSIS — R109 Unspecified abdominal pain: Secondary | ICD-10-CM | POA: Insufficient documentation

## 2011-12-25 DIAGNOSIS — R10814 Left lower quadrant abdominal tenderness: Secondary | ICD-10-CM | POA: Insufficient documentation

## 2011-12-25 HISTORY — DX: Unspecified convulsions: R56.9

## 2011-12-25 LAB — URINALYSIS, ROUTINE W REFLEX MICROSCOPIC
Ketones, ur: 15 mg/dL — AB
Leukocytes, UA: NEGATIVE
Nitrite: NEGATIVE
Specific Gravity, Urine: 1.029 (ref 1.005–1.030)
pH: 6.5 (ref 5.0–8.0)

## 2011-12-25 MED ORDER — OXYCODONE-ACETAMINOPHEN 5-325 MG PO TABS
2.0000 | ORAL_TABLET | Freq: Once | ORAL | Status: AC
  Administered 2011-12-25: 2 via ORAL
  Filled 2011-12-25: qty 2

## 2011-12-25 MED ORDER — KETOROLAC TROMETHAMINE 30 MG/ML IJ SOLN
30.0000 mg | Freq: Once | INTRAMUSCULAR | Status: DC
  Filled 2011-12-25: qty 1

## 2011-12-25 NOTE — ED Notes (Signed)
MD at bedside. 

## 2011-12-25 NOTE — ED Notes (Signed)
Patient transported to Ultrasound 

## 2011-12-25 NOTE — ED Provider Notes (Signed)
History     CSN: [id number]  Arrival date & time 12/25/11  1533   First MD Initiated Contact with Patient 12/25/11 1735      Chief Complaint  Patient presents with  . Abdominal Pain    (Consider location/radiation/quality/duration/timing/severity/associated sxs/prior treatment) Patient is a 26 y.o. female presenting with abdominal pain. The history is provided by the patient.  Abdominal Pain The primary symptoms of the illness include abdominal pain. The primary symptoms of the illness do not include fever, nausea, vomiting, diarrhea or dysuria. Episode onset: this morning. The onset of the illness was sudden. The problem has been gradually worsening.  Pregnant Now: Has merina device, does not believe she is pregnant. The patient has not had a change in bowel habit. Symptoms associated with the illness do not include chills, urgency or frequency.    Past Medical History  Diagnosis Date  . Seizures     History reviewed. No pertinent past surgical history.  History reviewed. No pertinent family history.  History  Substance Use Topics  . Smoking status: Never Smoker   . Smokeless tobacco: Not on file  . Alcohol Use: No    OB History    Grav Para Term Preterm Abortions TAB SAB Ect Mult Living                  Review of Systems  Constitutional: Negative for fever and chills.  Gastrointestinal: Positive for abdominal pain. Negative for nausea, vomiting and diarrhea.  Genitourinary: Negative for dysuria, urgency and frequency.  All other systems reviewed and are negative.    Allergies  Penicillins  Home Medications   Current Outpatient Rx  Name Route Sig Dispense Refill  . HYDROCODONE-ACETAMINOPHEN 2.5-500 MG PO TABS Oral Take 1 tablet by mouth every 8 (eight) hours as needed. For pain.    [name] LEVETIRACETAM 500 MG PO TABS Oral Take 1,000 mg by mouth daily.    . TRAMADOL HCL 50 MG PO TABS Oral Take 50 mg by mouth every 6 (six) hours as needed. For pain.      BP  103/56  Pulse 68  Temp(Src) 98.7 F (37.1 C) (Oral)  Resp 18  SpO2 99%  Physical Exam  Nursing note and vitals reviewed. Constitutional: She is oriented to person, place, and time. She appears well-developed and well-nourished. No distress.  HENT:  Head: Normocephalic and atraumatic.  Neck: Normal range of motion. Neck supple.  Cardiovascular: Normal rate and regular rhythm.  Exam reveals no gallop and no friction rub.   No murmur heard. Pulmonary/Chest: Effort normal and breath sounds normal. No respiratory distress. She has no wheezes.  Abdominal: Soft. Bowel sounds are normal. She exhibits no distension.       There is ttp in the llq.  There is no rebound or guarding.  Bowel sounds are normoactive.    Musculoskeletal: Normal range of motion.  Neurological: She is alert and oriented to person, place, and time.  Skin: Skin is warm and dry. She is not diaphoretic.    ED Course  Procedures (including critical care time)   Labs Reviewed  URINALYSIS, ROUTINE W REFLEX MICROSCOPIC  PREGNANCY, URINE   No results found.   No diagnosis found.    MDM  The urine looks okay.  A CT was done as her presentation was most consistent with a kidney stone.  This showed a 9 cm dermoid of the left ovary with a small amount of free fluid.  I spoke with the radiologist who could  not comment on whether there was a torsion or not.  I spoke with Dr. Claiborne Billings from Gyn who recommends an ultrasound.  If torsed, needs to see Gyn.  If not, can follow up with Gyn.          Geoffery Lyons, MD 12/25/11 2028

## 2011-12-25 NOTE — ED Provider Notes (Signed)
LauraPETER Tran 9:00 PM patient discussed in sign out with Dr. Judd Lien.  Patient presenting with lower abdominal pains. CT scan showed 9 cm left dermoid ovarian cyst. Patient going for ultrasound to rule out possible torsion. Dr. Judd Lien has discussed case with Dr. Claiborne Billings on call for GYN. Pending ultrasound results may consult Dr. Claiborne Billings for additional followup and transfer versus symptomatic treatment for patient in followup next week with Dr. Arlyce Dice.  Spoke with Dr. Britta Mccreedy with radiology. She is reviewed ultrasound studies. 9 cm dermoid-type mass in left ovary with concerning septations for malignancy. Ovary does appear to have blood flow.  Ultrasound findings discussed with Dr. Ignacia Palma. Plan to begin with Dr. Claiborne Billings on call for GYN for consult.  Spoke with Dr. Claiborne Billings. She was concerned that she or her practice may not feel that provide additional followup to patient'Tran lack of insurance. She recommends talking with [facility] teaching service.   I spoke with Dr. Vickey Sages on call for [facility]. She feels that based on the findings patient may return home if she is comfortable on pain medications. Plan is to have patient first attempt to call Dr. Arlyce Dice to see if she can have continued care by him. If patient unable to have care by Dr. Arlyce Dice she will call the teaching service at [facility]. If the patient is unable to followup with his hospital that she will call Dr. Vickey Sages practice and she will see patient within the next one to 2 weeks.   I Have discussed this plan with the patient and she agrees. She is comfortable at this time. I will provide a prescription for Percocets.  Medical screening examination/treatment/procedure(Tran) were performed by non-physician practitioner and as supervising physician I was immediately available for consultation/collaboration. Osvaldo Human, M.D.   Angus Seller, PA 12/27/11 0037  Carleene Cooper III, MD 12/28/11 [phone]

## 2011-12-25 NOTE — ED Notes (Signed)
Reports LLQ pain that started this am, radiates down her left leg, having nausea. Denies vaginal discharge, itching, urinary symptoms or v/d.

## 2011-12-26 MED ORDER — OXYCODONE-ACETAMINOPHEN 5-325 MG PO TABS: 1.0000 | ORAL_TABLET | Freq: Four times a day (QID) | ORAL | Status: AC | PRN

## 2011-12-26 NOTE — Discharge Instructions (Signed)
You were seen and evaluated today for your complaints of abdominal pain and nausea. Your tests today showed that you have a concerning 9 cm dermoid cyst on your left ovary. It is very important that you have followup with an OB/GYN specialist to have further testing and evaluation of this finding. Please call Dr. Marzetta Board office on Monday to schedule followup appointment. If you are unable to be seen by Dr. Arlyce Dice due to insurance or other reasons please call the [facility] internal medicine teaching service. If you are unable to followup with the [facility] teaching service you may also attempt to schedule an appointment with Dr. Renaldo Fiddler. If you develop any worsening abdominal pain not controlled by your pain medications at home, fever, chills, persistent nausea vomiting please return to the emergency room or the emergency room or the [facility].   Abdominal Pain Abdominal pain can be caused by many things. Your caregiver decides the seriousness of your pain by an examination and possibly blood tests and X-rays. Many cases can be observed and treated at home. Most abdominal pain is not caused by a disease and will probably improve without treatment. However, in many cases, more time must pass before a clear cause of the pain can be found. Before that point, it may not be known if you need more testing, or if hospitalization or surgery is needed. HOME CARE INSTRUCTIONS   Do not take laxatives unless directed by your caregiver.   Take pain medicine only as directed by your caregiver.   Only take over-the-counter or prescription medicines for pain, discomfort, or fever as directed by your caregiver.   Try a clear liquid diet (broth, tea, or water) for as long as directed by your caregiver. Slowly move to a bland diet as tolerated.  SEEK IMMEDIATE MEDICAL CARE IF:   The pain does not go away.   You have a fever.   You keep throwing up (vomiting).   The pain is felt only in  portions of the abdomen. Pain in the right side could possibly be appendicitis. In an adult, pain in the left lower portion of the abdomen could be colitis or diverticulitis.   You pass bloody or black tarry stools.  MAKE SURE YOU:   Understand these instructions.   Will watch your condition.   Will get help right away if you are not doing well or get worse.  Document Released: 07/07/2005 Document Revised: 09/16/2011 Document Reviewed: 05/15/2008 [facility] Patient Information 2012 [place], [place].   Ovarian Tumors The ovaries are small organs that produce eggs in women. They lie on each side of the uterus. Tumors are solid growths on the ovary, not like ovarian cysts that are filled with fluid. They can be cancerous or noncancerous. All solid tumors should be looked at to make sure they are not cancer tumors.  CAUSES  There are no known causes for developing a solid tumor on the ovary. However, there are several risk factors for developing cancerous tumors on the ovary, such as:  Aging.   [place] or [place] European descent.   Personal or family history of ovarian, colon and breast cancer.   Women with BRCA 1 or BRCA 2 genes are at high risk for getting ovarian cancer.   The use of fertility medications to get pregnant may increase the risk for getting ovarian cancer.   Late menopause (after age 43).   Women who become pregnant for the first time at 60 or older.  Having these  risk factors does not mean you will get ovarian cancer. However, you should know about them and report any that you have to your caregiver. Also, a woman with none of these risk factors can still get ovarian cancer. SYMPTOMS  In many cases there are no symptoms. Noncancerous tumors usually have no symptoms but cancerous tumors may have symptoms that are minor and resemble other health problems. The following are symptoms that may be important to diagnosing cancer of the ovary:  Unexplained weight loss.     Increase abdominal size.   Pain in the belly (abdomen).   Pain or pressure in the back and pelvis.   Tiredness.   Abnormal vaginal bleeding.   Loss of appetite.   Frequent urination or pressure on your bladder.   Indigestion, increase gas and bloating.   Painful sexual intercourse.  DIAGNOSIS   During an exam, an abnormal mass may be found in the pelvis. It is important to have a rectovaginal examination to help find pelvic masses, especially in women over 33 years old.   An ultrasound may be done.   X-ray, CT scan or MRI imaging.   Blood tests.   A Pap test does not help in diagnosing tumors or cancer of the ovary. New screening tests are always being studied to detect early ovarian cancer.  TREATMENT   All solid tumors of the ovary should be evaluated, usually with surgery, to make sure they are not cancerous.   The tumor will be studied in the lab under the microscope to see if it is cancer.   Noncancerous tumors can be removed surgically with or without removing the ovary.   Cancerous tumors usually are removed with the ovary and sometimes both ovaries are removed with the Fallopian tubes, uterus and surrounding lymph nodes to see if the cancer has spread.   Cancerous tumors may also be treated along with the surgery with radiation, chemotherapy or both.   The surgeon should be a gynecology oncologist (cancer specialist in gynecology cancer surgery) and the chemotherapist and radiation therapist should be experienced specialists in their field.  HOME CARE INSTRUCTIONS   Inform your caregiver if you or anyone in your family has had cancer.   Follow your caregiver's advice and recommendations regarding medications and follow up care.   Get a yearly physical and gynecology exams. This includes a rectovaginal exam if you are 71 years old or older.  SEEK MEDICAL CARE IF:   You have any of the above symptoms that have not gone away after a week of treatment.    You are losing weight for no reason.   You feel generally ill.  Document Released: 07/06/2008 Document Revised: 09/16/2011 Document Reviewed: 07/06/2008 [facility] Patient Information 2012 [place], [place].

## 2011-12-29 ENCOUNTER — Encounter (HOSPITAL_COMMUNITY): Payer: Self-pay | Admitting: Pharmacist

## 2012-01-07 ENCOUNTER — Encounter (HOSPITAL_COMMUNITY)
Admission: RE | Admit: 2012-01-07 | Discharge: 2012-01-07 | Disposition: A | Discharge status: Home / Self Care | Payer: Medicaid Other | Source: Ambulatory Visit | Attending: Obstetrics & Gynecology | Admitting: Obstetrics & Gynecology

## 2012-01-07 ENCOUNTER — Encounter (HOSPITAL_COMMUNITY): Payer: Self-pay

## 2012-01-07 LAB — CBC
MCH: 30.1 pg (ref 26.0–34.0)
MCV: 89.9 fL (ref 78.0–100.0)
Platelets: 260 10*3/uL (ref 150–400)
RDW: 13.1 % (ref 11.5–15.5)
WBC: 5.8 10*3/uL (ref 4.0–10.5)

## 2012-01-07 NOTE — Patient Instructions (Signed)
   Your procedure is scheduled on: Monday April 1  Enter through the [employer] [facility] at:11:30am  Pick up the phone at the desk and dial [phone] and inform us of your arrival.  Please call this number if you have any problems the morning of surgery: [phone]  Remember: Do not eat food after midnight: Sunday Do not drink clear liquids after: 9am Take these medicines the morning of surgery with a SIP OF WATER:  Keppra  Do not wear jewelry, make-up, or FINGER nail polish Do not wear lotions, powders, perfumes or deodorant. Do not shave 48 hours prior to surgery. Do not bring valuables to the hospital. Contacts, dentures or bridgework may not be worn into surgery.  Leave suitcase in the car. After Surgery it may be brought to your room. For patients being admitted to the hospital, checkout time is 11:00am the day of discharge.  Remember to use your hibiclens as instructed.Please shower with 1/2 bottle the evening before your surgery and the other 1/2 bottle the morning of surgery. Neck down avoiding private area.

## 2012-01-09 MED ORDER — CEFAZOLIN SODIUM-DEXTROSE 2-3 GM-% IV SOLR
2.0000 g | INTRAVENOUS | Status: AC
  Administered 2012-01-10: 2 g via INTRAVENOUS
  Filled 2012-01-09: qty 50

## 2012-01-09 NOTE — H&P (Signed)
Laura Tran is an 26 y.o. female. She presents for removal of bilateral ovarian teratomas.  HPI:  She was found to have 4 cm bilateral ovarian teratomas on ob ultrasound exams during her last pregnancy in 2011.  At cesarean delivery in 2011, bilateral 3 cm ovarian cysts were noted in the op report.  She presented to [facility] 25 December 2011 with acute onset severe abdominal pain.  Ultrasound and CT scan showed a persistent 4 cm right ovarian dermoid and a 9 cm left ovarian mass that was suspicious for an immature teratoma.  Pertinent Gynecological History:  Contraception: IUD, Mirena DES exposure: denies Blood transfusions: Received transfusion after her cesarean delivery in 2009 Sexually transmitted diseases: no past history Previous GYN Procedures: 2 cesarean deliveries  Last pap: normal  OB History: G2, P2   Menstrual History: Menarche age: 89 No LMP recorded. Patient is not currently having periods (Reason: IUD).    Past Medical History  Diagnosis Date  . Seizures     last seizure was 05/2011    Past Surgical History  Procedure Date  . Cesarean section     x 2  . Neck surgery 1997    No family history on file.  Social History:  reports that she has never smoked. She does not have any smokeless tobacco history on file. She reports that she does not drink alcohol or use illicit drugs.  Allergies:  Allergies  Allergen Reactions  . Penicillins     Doesn't know reaction? Childhood reaction  . Amoxicillin Rash    No prescriptions prior to admission    Review of Systems  Constitutional: Negative.   HENT: Negative.   Eyes: Negative.   Respiratory: Negative.   Cardiovascular: Negative.   Gastrointestinal: Negative.   Genitourinary: Negative.   Musculoskeletal: Negative.   Skin: Negative.   Neurological: Negative.   Endo/Heme/Allergies: Negative.   Psychiatric/Behavioral: Negative.     There were no vitals taken for this visit. Physical Exam  Constitutional: She  appears well-developed and well-nourished.  Eyes: Pupils are equal, round, and reactive to light.  Neck: Neck supple. No thyromegaly present.  Cardiovascular: Normal rate.   Respiratory: Breath sounds normal.  GI: She exhibits mass.  Genitourinary: Vagina normal and uterus normal.  Musculoskeletal: Normal range of motion.  Neurological: She is alert.  Skin: Skin is warm.  Psychiatric: She has a normal mood and affect.   Assessment/Plan: Her case was discussed with Dr. De Blanch.  Because of the risk of malignancy in the left ovarian mass, an oophorectomy via laparotomy was felt to be the best approach.   A left ovarian cystectomy will be performed to preserve fertility.  Because there is no evidence of malignancy or metastases on CT scan,  Dr. Stanford Breed felt that referral to gyn oncology was not indicated for this surgery.  Ryen Heitmeyer D 01/09/2012, 3:18 PM

## 2012-01-10 ENCOUNTER — Inpatient Hospital Stay (HOSPITAL_COMMUNITY)
Admission: RE | Admit: 2012-01-10 | Discharge: 2012-01-12 | DRG: 743 | Disposition: A | Discharge status: Home / Self Care | Payer: Medicaid Other | Source: Ambulatory Visit | Attending: Obstetrics & Gynecology | Admitting: Obstetrics & Gynecology

## 2012-01-10 ENCOUNTER — Encounter (HOSPITAL_COMMUNITY): Payer: Self-pay | Admitting: Anesthesiology

## 2012-01-10 ENCOUNTER — Encounter (HOSPITAL_COMMUNITY)
Admission: RE | Disposition: A | Discharge status: Home / Self Care | Payer: Self-pay | Source: Ambulatory Visit | Attending: Obstetrics & Gynecology

## 2012-01-10 ENCOUNTER — Ambulatory Visit (HOSPITAL_COMMUNITY): Payer: Medicaid Other | Admitting: Anesthesiology

## 2012-01-10 DIAGNOSIS — Z01818 Encounter for other preprocedural examination: Secondary | ICD-10-CM

## 2012-01-10 DIAGNOSIS — N83209 Unspecified ovarian cyst, unspecified side: Secondary | ICD-10-CM | POA: Diagnosis present

## 2012-01-10 DIAGNOSIS — Z01812 Encounter for preprocedural laboratory examination: Secondary | ICD-10-CM

## 2012-01-10 DIAGNOSIS — Z9889 Other specified postprocedural states: Secondary | ICD-10-CM

## 2012-01-10 DIAGNOSIS — D279 Benign neoplasm of unspecified ovary: Principal | ICD-10-CM | POA: Diagnosis present

## 2012-01-10 HISTORY — PX: LAPAROTOMY: SHX154

## 2012-01-10 LAB — HCG, QUANTITATIVE, PREGNANCY: hCG, Beta Chain, Quant, S: <1 m[IU]/mL (ref <5)

## 2012-01-10 SURGERY — LAPAROTOMY, EXPLORATORY
Anesthesia: General | Site: Abdomen | Wound class: Clean

## 2012-01-10 MED ORDER — KETOROLAC TROMETHAMINE 30 MG/ML IJ SOLN
15.0000 mg | Freq: Once | INTRAMUSCULAR | Status: DC | PRN
Start: 2012-01-10 — End: 2012-01-10

## 2012-01-10 MED ORDER — ROCURONIUM BROMIDE 50 MG/5ML IV SOLN
INTRAVENOUS | Status: AC
  Filled 2012-01-10: qty 1

## 2012-01-10 MED ORDER — MIDAZOLAM HCL 2 MG/2ML IJ SOLN
INTRAMUSCULAR | Status: AC
  Filled 2012-01-10: qty 2

## 2012-01-10 MED ORDER — HYDROMORPHONE HCL PF 1 MG/ML IJ SOLN
INTRAMUSCULAR | Status: AC
  Filled 2012-01-10: qty 1

## 2012-01-10 MED ORDER — FENTANYL CITRATE 0.05 MG/ML IJ SOLN
25.0000 ug | INTRAMUSCULAR | Status: DC | PRN
  Administered 2012-01-10 (×3): 50 ug via INTRAVENOUS

## 2012-01-10 MED ORDER — KETOROLAC TROMETHAMINE 30 MG/ML IJ SOLN
30.0000 mg | Freq: Four times a day (QID) | INTRAMUSCULAR | Status: DC
  Filled 2012-01-10: qty 1

## 2012-01-10 MED ORDER — IBUPROFEN 600 MG PO TABS
600.0000 mg | ORAL_TABLET | Freq: Four times a day (QID) | ORAL | Status: DC | PRN
  Administered 2012-01-11 – 2012-01-12 (×3): 600 mg via ORAL
  Filled 2012-01-10 (×3): qty 1

## 2012-01-10 MED ORDER — KETOROLAC TROMETHAMINE 60 MG/2ML IM SOLN
INTRAMUSCULAR | Status: DC | PRN
  Administered 2012-01-10: 30 mg via INTRAMUSCULAR

## 2012-01-10 MED ORDER — PROPOFOL 10 MG/ML IV EMUL
INTRAVENOUS | Status: DC | PRN
  Administered 2012-01-10: 170 mg via INTRAVENOUS

## 2012-01-10 MED ORDER — NEOSTIGMINE METHYLSULFATE 1 MG/ML IJ SOLN
INTRAMUSCULAR | Status: AC
  Filled 2012-01-10: qty 10

## 2012-01-10 MED ORDER — GLYCOPYRROLATE 0.2 MG/ML IJ SOLN
INTRAMUSCULAR | Status: DC | PRN
  Administered 2012-01-10: .8 mg via INTRAVENOUS

## 2012-01-10 MED ORDER — MEPERIDINE HCL 25 MG/ML IJ SOLN
6.2500 mg | INTRAMUSCULAR | Status: DC | PRN
Start: 2012-01-10 — End: 2012-01-10

## 2012-01-10 MED ORDER — KETOROLAC TROMETHAMINE 30 MG/ML IJ SOLN
30.0000 mg | Freq: Four times a day (QID) | INTRAMUSCULAR | Status: DC
  Administered 2012-01-10 – 2012-01-11 (×3): 30 mg via INTRAVENOUS
  Filled 2012-01-10 (×2): qty 1

## 2012-01-10 MED ORDER — FENTANYL CITRATE 0.05 MG/ML IJ SOLN
INTRAMUSCULAR | Status: DC | PRN
  Administered 2012-01-10 (×5): 50 ug via INTRAVENOUS
  Administered 2012-01-10: 100 ug via INTRAVENOUS

## 2012-01-10 MED ORDER — NEOSTIGMINE METHYLSULFATE 1 MG/ML IJ SOLN
INTRAMUSCULAR | Status: DC | PRN
  Administered 2012-01-10: 4 mg via INTRAVENOUS

## 2012-01-10 MED ORDER — DEXAMETHASONE SODIUM PHOSPHATE 10 MG/ML IJ SOLN
INTRAMUSCULAR | Status: DC | PRN
  Administered 2012-01-10: 10 mg via INTRAVENOUS

## 2012-01-10 MED ORDER — ROCURONIUM BROMIDE 100 MG/10ML IV SOLN
INTRAVENOUS | Status: DC | PRN
  Administered 2012-01-10: 35 mg via INTRAVENOUS

## 2012-01-10 MED ORDER — ONDANSETRON HCL 4 MG/2ML IJ SOLN
INTRAMUSCULAR | Status: AC
  Filled 2012-01-10: qty 2

## 2012-01-10 MED ORDER — FENTANYL CITRATE 0.05 MG/ML IJ SOLN
INTRAMUSCULAR | Status: AC
  Filled 2012-01-10: qty 2

## 2012-01-10 MED ORDER — MIDAZOLAM HCL 2 MG/2ML IJ SOLN: 0.5000 mg | Freq: Once | INTRAMUSCULAR | Status: DC | PRN

## 2012-01-10 MED ORDER — KETOROLAC TROMETHAMINE 30 MG/ML IJ SOLN
INTRAMUSCULAR | Status: AC
  Filled 2012-01-10: qty 2

## 2012-01-10 MED ORDER — ONDANSETRON HCL 4 MG/2ML IJ SOLN
INTRAMUSCULAR | Status: DC | PRN
  Administered 2012-01-10: 4 mg via INTRAVENOUS

## 2012-01-10 MED ORDER — PROPOFOL 10 MG/ML IV EMUL
INTRAVENOUS | Status: AC
  Filled 2012-01-10: qty 20

## 2012-01-10 MED ORDER — DEXAMETHASONE SODIUM PHOSPHATE 10 MG/ML IJ SOLN
INTRAMUSCULAR | Status: AC
  Filled 2012-01-10: qty 1

## 2012-01-10 MED ORDER — FENTANYL CITRATE 0.05 MG/ML IJ SOLN
INTRAMUSCULAR | Status: AC
  Administered 2012-01-10: 50 ug via INTRAVENOUS
  Filled 2012-01-10: qty 2

## 2012-01-10 MED ORDER — ACETAMINOPHEN 325 MG PO TABS: 325.0000 mg | ORAL_TABLET | ORAL | Status: DC | PRN

## 2012-01-10 MED ORDER — KETOROLAC TROMETHAMINE 30 MG/ML IJ SOLN
INTRAMUSCULAR | Status: DC | PRN
  Administered 2012-01-10: 30 mg via INTRAVENOUS

## 2012-01-10 MED ORDER — PROMETHAZINE HCL 25 MG/ML IJ SOLN: 6.2500 mg | INTRAMUSCULAR | Status: DC | PRN

## 2012-01-10 MED ORDER — HYDROMORPHONE HCL PF 1 MG/ML IJ SOLN
0.2000 mg | INTRAMUSCULAR | Status: DC | PRN
  Administered 2012-01-10 (×2): 0.6 mg via INTRAVENOUS
  Filled 2012-01-10 (×2): qty 1

## 2012-01-10 MED ORDER — HYDROMORPHONE HCL PF 1 MG/ML IJ SOLN
INTRAMUSCULAR | Status: DC | PRN
  Administered 2012-01-10 (×2): 1 mg via INTRAVENOUS

## 2012-01-10 MED ORDER — OXYCODONE-ACETAMINOPHEN 5-325 MG PO TABS
1.0000 | ORAL_TABLET | ORAL | Status: DC | PRN
  Administered 2012-01-10 – 2012-01-12 (×6): 2 via ORAL
  Filled 2012-01-10 (×6): qty 2

## 2012-01-10 MED ORDER — LACTATED RINGERS IV SOLN
INTRAVENOUS | Status: DC
  Administered 2012-01-10 (×2): via INTRAVENOUS

## 2012-01-10 MED ORDER — GLYCOPYRROLATE 0.2 MG/ML IJ SOLN
INTRAMUSCULAR | Status: AC
  Filled 2012-01-10: qty 2

## 2012-01-10 MED ORDER — FENTANYL CITRATE 0.05 MG/ML IJ SOLN
INTRAMUSCULAR | Status: AC
  Filled 2012-01-10: qty 5

## 2012-01-10 MED ORDER — LEVETIRACETAM 500 MG PO TABS
1000.0000 mg | ORAL_TABLET | Freq: Every day | ORAL | Status: DC
  Administered 2012-01-11 – 2012-01-12 (×2): 1000 mg via ORAL
  Filled 2012-01-10 (×2): qty 2

## 2012-01-10 MED ORDER — LIDOCAINE HCL (CARDIAC) 20 MG/ML IV SOLN
INTRAVENOUS | Status: AC
  Filled 2012-01-10: qty 5

## 2012-01-10 MED ORDER — MIDAZOLAM HCL 5 MG/5ML IJ SOLN
INTRAMUSCULAR | Status: DC | PRN
  Administered 2012-01-10: 2 mg via INTRAVENOUS

## 2012-01-10 MED ORDER — LACTATED RINGERS IV SOLN
INTRAVENOUS | Status: DC
  Administered 2012-01-10 – 2012-01-11 (×2): via INTRAVENOUS

## 2012-01-10 SURGICAL SUPPLY — 31 items
CANISTER SUCTION 2500CC (MISCELLANEOUS) ×3 IMPLANT
CLOTH BEACON ORANGE TIMEOUT ST (SAFETY) ×3 IMPLANT
CONT PATH 16OZ SNAP LID 3702 (MISCELLANEOUS) ×3 IMPLANT
DECANTER SPIKE VIAL GLASS SM (MISCELLANEOUS) IMPLANT
DERMABOND ADVANCED (GAUZE/BANDAGES/DRESSINGS) ×2
DERMABOND ADVANCED .7 DNX12 (GAUZE/BANDAGES/DRESSINGS) ×4 IMPLANT
DRSG COVADERM 4X10 (GAUZE/BANDAGES/DRESSINGS) ×3 IMPLANT
GLOVE ECLIPSE 6.0 STRL STRAW (GLOVE) ×3 IMPLANT
GLOVE ECLIPSE 6.5 STRL STRAW (GLOVE) ×3 IMPLANT
GOWN PREVENTION PLUS LG XLONG (DISPOSABLE) ×9 IMPLANT
NEEDLE HYPO 25X1 1.5 SAFETY (NEEDLE) IMPLANT
NS IRRIG 1000ML POUR BTL (IV SOLUTION) ×3 IMPLANT
PACK ABDOMINAL GYN (CUSTOM PROCEDURE TRAY) ×3 IMPLANT
PAD OB MATERNITY 4.3X12.25 (PERSONAL CARE ITEMS) ×3 IMPLANT
SPONGE LAP 18X18 X RAY DECT (DISPOSABLE) ×6 IMPLANT
STAPLER VISISTAT 35W (STAPLE) ×3 IMPLANT
SUT VIC AB 0 CT1 18XCR BRD8 (SUTURE) ×2 IMPLANT
SUT VIC AB 0 CT1 27 (SUTURE) ×2
SUT VIC AB 0 CT1 27XBRD ANBCTR (SUTURE) ×4 IMPLANT
SUT VIC AB 0 CT1 8-18 (SUTURE) ×1
SUT VIC AB 3-0 CT1 27 (SUTURE) ×1
SUT VIC AB 3-0 CT1 TAPERPNT 27 (SUTURE) ×2 IMPLANT
SUT VIC AB 3-0 SH 27 (SUTURE)
SUT VIC AB 3-0 SH 27X BRD (SUTURE) IMPLANT
SUT VIC AB 4-0 SH 27 (SUTURE) ×1
SUT VIC AB 4-0 SH 27XANBCTRL (SUTURE) ×2 IMPLANT
SUT VICRYL 5 0 PS 3 18 (SUTURE) IMPLANT
SYR CONTROL 10ML LL (SYRINGE) IMPLANT
TOWEL OR 17X24 6PK STRL BLUE (TOWEL DISPOSABLE) ×6 IMPLANT
TRAY FOLEY CATH 14FR (SET/KITS/TRAYS/PACK) ×3 IMPLANT
WATER STERILE IRR 1000ML POUR (IV SOLUTION) ×3 IMPLANT

## 2012-01-10 NOTE — Transfer of Care (Signed)
Immediate Anesthesia Transfer of Care Note  Patient: Laura Tran  Procedure(s) Performed: Procedure(s) (LRB): EXPLORATORY LAPAROTOMY (N/A)  Patient Location: PACU  Anesthesia Type: General  Level of Consciousness: awake, alert  and oriented  Airway & Oxygen Therapy: Patient Spontanous Breathing and Patient connected to nasal cannula oxygen  Post-op Assessment: Report given to PACU RN and Post -op Vital signs reviewed and stable  Post vital signs: Reviewed and stable  Complications: No apparent anesthesia complications

## 2012-01-10 NOTE — Anesthesia Procedure Notes (Signed)
Procedure Name: Intubation Date/Time: 01/10/2012 1:05 PM Performed by: Graciela Husbands Pre-anesthesia Checklist: Suction available, Emergency Drugs available, Timeout performed, Patient identified and Patient being monitored Patient Re-evaluated:Patient Re-evaluated prior to inductionOxygen Delivery Method: Circle system utilized Preoxygenation: Pre-oxygenation with 100% oxygen Intubation Type: IV induction Ventilation: Mask ventilation without difficulty Laryngoscope Size: Mac and 3 Grade View: Grade I Tube type: Oral Tube size: 7.0 mm Number of attempts: 1 Airway Equipment and Method: Stylet Placement Confirmation: ETT inserted through vocal cords under direct vision,  positive ETCO2 and breath sounds checked- equal and bilateral Secured at: 21 cm Tube secured with: Tape Dental Injury: Teeth and Oropharynx as per pre-operative assessment

## 2012-01-10 NOTE — Progress Notes (Signed)
I have interviewed and performed the pertinent exams on my patient to confirm that there have been no significant changes in her condition since the dictation of her history and physical exam.  

## 2012-01-10 NOTE — Op Note (Signed)
Patient Name: Laura Tran MRN: [id number]  Date of Surgery: 01/10/2012    PREOPERATIVE DIAGNOSIS: bilateral ovarian cyst  POSTOPERATIVE DIAGNOSIS: bilateral ovarian cyst   PROCEDURE: Exploratory laparotomy, left oophorectomy, right ovarian cystectomy  SURGEON: Caralyn Guile. Arlyce Dice M.D.  ANESTHESIA: General endotracheal  ESTIMATED BLOOD LOSS: 100 ml  FINDINGS: 7 cm cystic left ovary with smooth ovarian capsule, 3 cm right ovarian cyst, normal tubes and uterus.  No pelvic adhesions or suspicious masses.   INDICATIONS: This is a 26 y.o.  [place] 2 who is admitted for bilateral ovarian cysts. Patient was seen at [facility] 2 weeks ago with severe abdominal pain.  CT scan showed an 8 cm left ovarian mass suspicious of an immature teratoma.  The right ovary showed a 3 cm benign appearing teratoma.  Patient had known bilateral 4 cm ovarian teratomas since 2011.  PROCEDURE IN DETAIL: The patient was taken to the operating room and general endotracheal anesthesia was induced. The abdomen was prepped and draped in a sterile fashion and the bladder was catheterized.  A low transverse abdominal incision was made and carried down to the fascia. The fascia was opened transversely and the rectus sheath was dissected from the underlying rectus muscle. The rectus midline was identified and opened by sharp and blunt dissection. The peritoneum was opened. Pelvic washings were obtained.  The left ovary was elevated through the incision and the vascular pedical was clamped cut and ligated.  The ovary was removed intact.  The left tube appeared normal and was not removed.  An Alexis retractor was placed and the right ovary was elevated and a cystectomy was performed with sharp dissection.  The right teratoma was removed intact.  The ovarian defect was cauterized for hemostasis and the capsule was closed with a 4-0 vicryl baseball stitch.  The peritoneum and rectus muscle were closed in the midline with running 3-0  Vicryl suture. The fascia was closed with running 0 Vicryl suture and the skin was closed with subcuticular 4-0 vicryl suture. All sponge and instrument counts were correct.  The patient tolerated the procedure well and left the operating room in good condition.

## 2012-01-10 NOTE — Anesthesia Preprocedure Evaluation (Signed)
Anesthesia Evaluation  Patient identified by MRN, date of birth, ID band Patient awake    Reviewed: Allergy & Precautions, H&P , Patient's Chart, lab work & pertinent test results, reviewed documented beta blocker date and time   History of Anesthesia Complications Negative for: history of anesthetic complications  Airway Mallampati: II TM Distance: >3 FB Neck ROM: full    Dental No notable dental hx.    Pulmonary neg pulmonary ROS,  breath sounds clear to auscultation  Pulmonary exam normal       Cardiovascular Exercise Tolerance: Good negative cardio ROS  Rhythm:regular Rate:Normal     Neuro/Psych Seizures -,  negative neurological ROS  negative psych ROS   GI/Hepatic negative GI ROS, Neg liver ROS,   Endo/Other  negative endocrine ROS  Renal/GU negative Renal ROS     Musculoskeletal   Abdominal   Peds  Hematology negative hematology ROS (+)   Anesthesia Other Findings   Reproductive/Obstetrics negative OB ROS                           Anesthesia Physical Anesthesia Plan  ASA: II  Anesthesia Plan: General ETT   Post-op Pain Management:    Induction:   Airway Management Planned:   Additional Equipment:   Intra-op Plan:   Post-operative Plan:   Informed Consent: I have reviewed the patients History and Physical, chart, labs and discussed the procedure including the risks, benefits and alternatives for the proposed anesthesia with the patient or authorized representative who has indicated his/her understanding and acceptance.   Dental Advisory Given  Plan Discussed with: CRNA and Surgeon  Anesthesia Plan Comments:         Anesthesia Quick Evaluation

## 2012-01-11 ENCOUNTER — Encounter (HOSPITAL_COMMUNITY): Payer: Self-pay | Admitting: Obstetrics & Gynecology

## 2012-01-11 LAB — CBC
HCT: 28.3 % — ABNORMAL LOW (ref 36.0–46.0)
Hemoglobin: 9.6 g/dL — ABNORMAL LOW (ref 12.0–15.0)
MCH: 30.2 pg (ref 26.0–34.0)
MCHC: 33.9 g/dL (ref 30.0–36.0)
MCV: 89 fL (ref 78.0–100.0)
RDW: 12.8 % (ref 11.5–15.5)

## 2012-01-11 MED ORDER — ONDANSETRON 4 MG PO TBDP
4.0000 mg | ORAL_TABLET | Freq: Once | ORAL | Status: AC
  Administered 2012-01-11: 4 mg via ORAL
  Filled 2012-01-11: qty 1

## 2012-01-11 MED ORDER — ONDANSETRON 8 MG PO TBDP
8.0000 mg | ORAL_TABLET | Freq: Once | ORAL | Status: AC
  Administered 2012-01-11: 8 mg via ORAL

## 2012-01-11 NOTE — Progress Notes (Signed)
Will continue post op observation.  Patient still with significant post op pain.

## 2012-01-11 NOTE — Anesthesia Postprocedure Evaluation (Signed)
  Anesthesia Post-op Note  Patient: Laura Tran  Procedure(s) Performed: Procedure(s) (LRB): EXPLORATORY LAPAROTOMY (N/A)  Patient Location: Women's Unit  Anesthesia Type: General  Level of Consciousness: awake, alert  and oriented  Airway and Oxygen Therapy: Patient Spontanous Breathing  Post-op Pain: none  Post-op Assessment: Post-op Vital signs reviewed and Patient's Cardiovascular Status Stable  Post-op Vital Signs: Reviewed and stable  Complications: No apparent anesthesia complications

## 2012-01-11 NOTE — Progress Notes (Signed)
Post Op Day 1 Subjective: up ad lib, voiding, tolerating PO, + flatus and mild to moderate nausea  Objective: Blood pressure 101/63, pulse 61, temperature 99 F (37.2 C), temperature source Oral, resp. rate 18, height 5\' 5"  (1.651 m), weight 146 lb (66.225 kg), SpO2 99.00%.  Physical Exam:  GI: soft, normal post op tenderness; bowel sounds normal; no masses,  no organomegaly and incision: clean and dry Incision: healing well   Basename 01/11/12 0525  HGB 9.6*  HCT 28.3*    Assessment/Plan: Doing well.  Will reevaluate later today for discharge   LOS: 1 day   Kaity Pitstick D 01/11/2012, 1:07 PM

## 2012-01-12 MED ORDER — OXYCODONE-ACETAMINOPHEN 5-325 MG PO TABS: 1.0000 | ORAL_TABLET | ORAL | Status: AC | PRN

## 2012-01-12 MED ORDER — IBUPROFEN 600 MG PO TABS: 600.0000 mg | ORAL_TABLET | Freq: Four times a day (QID) | ORAL | Status: AC | PRN

## 2012-01-12 MED FILL — Heparin Sodium (Porcine) Inj 5000 Unit/ML: INTRAMUSCULAR | Qty: 1 | Status: AC

## 2012-01-12 NOTE — Discharge Summary (Signed)
Physician Discharge Summary  Patient ID: Laura Tran MRN: [id number] DOB/AGE: Jan 22, 1986 25 y.o.  Admit date: 01/10/2012 Discharge date: 01/12/2012  Admission Diagnoses: Bilateral Teratoma, suspicious for malignancy  Discharge Diagnoses: Bilateral Teratoma, Path pending Active Problems:  * No active hospital problems. *    Discharged Condition: good  Hospital Course: Admitted post op laparotomy with left oophorectomy and right cystectomy.  On morning of post op day 2 patient was passing gas, tolerating regular diet, voiding, and ambulating without problem.    Treatments: Removal via laparotomy of left ovary and right ovarian cyst.  Discharge Exam: Blood pressure 97/60, pulse 58, temperature 97.9 F (36.6 C), temperature source Oral, resp. rate 18, height 5\' 5"  (1.651 m), weight 146 lb (66.225 kg), SpO2 98.00%. GI: soft, non-tender; bowel sounds normal; no masses,  no organomegaly Incision/Wound:no inflamation  Disposition: 01-Home or Self Care  Discharge Orders    Future Orders Please Complete By Expires   Discharge patient        Medication List  As of 01/12/2012  7:47 AM   STOP taking these medications         HYDROcodone-acetaminophen 5-325 MG per tablet         TAKE these medications         ibuprofen 600 MG tablet   Commonly known as: ADVIL,MOTRIN   Take 1 tablet (600 mg total) by mouth every 6 (six) hours as needed for pain (mild pain).      levETIRAcetam 500 MG tablet   Commonly known as: KEPPRA   Take 1,000 mg by mouth daily.      oxyCODONE-acetaminophen 5-325 MG per tablet   Commonly known as: PERCOCET   Take 1-2 tablets by mouth every 3 (three) hours as needed (moderate to severe pain (when tolerating fluids)).           Follow-up Information    Follow up with Mickel Baas, MD in 3 weeks.   Contact information:   [address] [place] [address] [phone]          Signed: Mickel Baas 01/12/2012,  7:47 AM

## 2012-01-12 NOTE — Discharge Instructions (Signed)
Laparotomy care afterLaparotomy Care After Please read the instructions outlined below and refer to this sheet in the next few weeks. These discharge instructions provide you with general information on caring for yourself after you leave the hospital. Your doctor may also give you specific instructions. While your treatment has been planned according to the most current medical practices available, unavoidable complications occasionally occur. If you have any problems or questions after discharge, please call your doctor. HOME CARE INSTRUCTIONS ACTIVITY  Rest as much as possible the first two weeks at home.   Avoid strenuous activity such as heavy lifting (more than 10 pounds), pushing or pulling. Limit stair climbing to once or twice a day for the first week, then slowly increase this activity.   Take frequent rest periods throughout the day.   Talk with your doctor about when you may resume your usual physical activity.   Patients need to be out of bed and walking as much as possible. This decreases the chance of:   Blood clots.   Pneumonia.  NUTRITION  You can resume your normal diet once you regain bowel function.   Drink plenty of fluids (6-8 glasses a day or as instructed).   Eat a well-balanced diet.   Daily portions of food from the meat (protein), milk, vegetable, and bread groups are necessary for your health.  ELIMINATION It is very important not to strain during bowel movements. If constipation should occur, you may:  Take a mild laxative (such as Milk of Magnesia)   Add fruit and bran to your diet   Drink more fluids  HYGIENE  You may wash your hair.   Take showers not baths until 4-6 weeks after surgery.   If your incision is closed, you may take a shower or tub bath.  FEVER If you feel feverish or have shaking chills, take your temperature. If it is 102 F (38.9 C), call your doctor. The fever may mean there is an infection. PAIN CONTROL  Mild  discomfort may occur.   Only take over-the-counter or prescription medicines for pain, discomfort, or fever as directed by your caregiver.   If a prescription was given, please follow your doctor's directions.   If pain is not relieved or becomes more severe, notify your doctor.  INCISION CARE  Keep your incision site clean with soap and water.   Do not use a dressing unless your cut (incision) from surgery is draining or irritated.   If you have small adhesive strips in place and they do not fall off within 10 days, carefully peel them off.   Check your incision and surrounding area daily for any redness, swelling, discoloration, heavy drainage or separation of the skin. If any of these are present, notify your doctor.  SEXUAL INTERCOURSE Do not have sexual intercourse until your follow-up appointment, unless your doctor tells you otherwise. SEEK MEDICAL CARE IF:   You are unable to tolerate food/drinks.   You are unable to pass gas or have a bowel movement.  Document Released: 05/11/2004 Document Revised: 09/16/2011 Document Reviewed: 09/26/2007 [facility] Patient Information 2012 [place], [place].

## 2012-01-12 NOTE — Progress Notes (Signed)
UR chart review completed.  

## 2012-01-12 NOTE — Progress Notes (Signed)
Pt discharged to home with grandmother.  Condition stable.  Pt to car via wheelchair with NT.  No equipment for home ordered at discharge.

## 2013-04-02 ENCOUNTER — Other Ambulatory Visit: Payer: Self-pay

## 2013-04-02 MED ORDER — LEVETIRACETAM 500 MG PO TABS: 500.0000 mg | ORAL_TABLET | Freq: Two times a day (BID) | ORAL | Status: DC

## 2013-05-26 IMAGING — US US PELVIS COMPLETE
1 series · 13 of 25 positions shown · non-contrast
Comparison: CT abdomen pelvis 12/25/2011

CLINICAL DATA: Left pelvic pain.  Patient with known bilateral
ovarian dermoids.  Enlargement of the left ovary is normal and
today's CT scan.

TRANSABDOMINAL AND TRANSVAGINAL ULTRASOUND OF PELVIS
DOPPLER ULTRASOUND OF OVARIES
TECHNIQUE: Both transabdominal and transvaginal ultrasound
examinations of the pelvis were performed. Transabdominal technique
was performed for global imaging of the pelvis including uterus,
ovaries, adnexal regions, and pelvic cul-de-sac.
It was necessary to proceed with endovaginal exam following the
transabdominal exam to visualize the ovaries.
Color and duplex Doppler ultrasound was utilized to evaluate blood
flow to the ovaries.

[Series 1: us pelvis complete · 0.22mm/px · 13 of 92 slices shown]
[im 1/92]
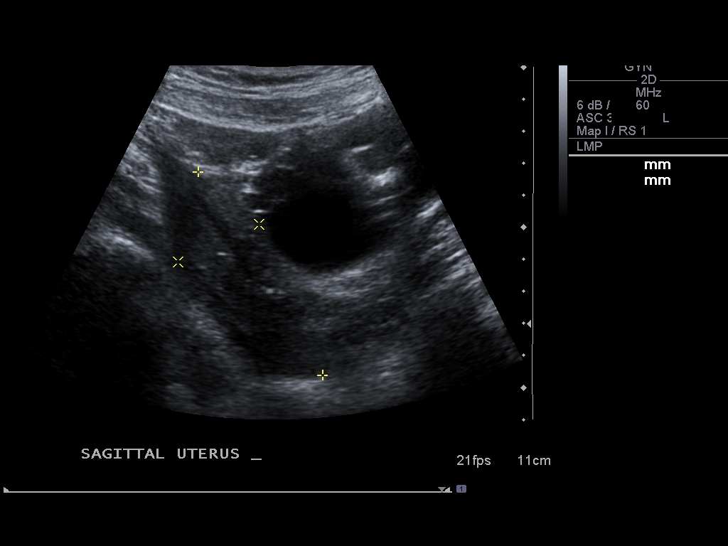
[im 8/92]
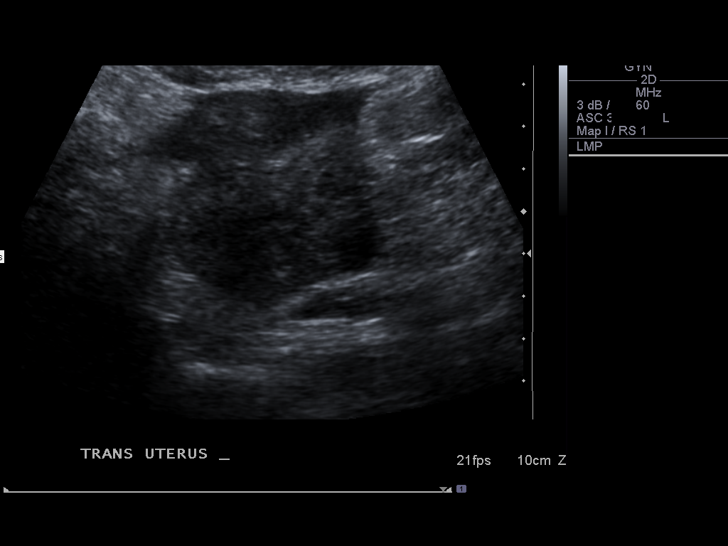
[im 16/92]
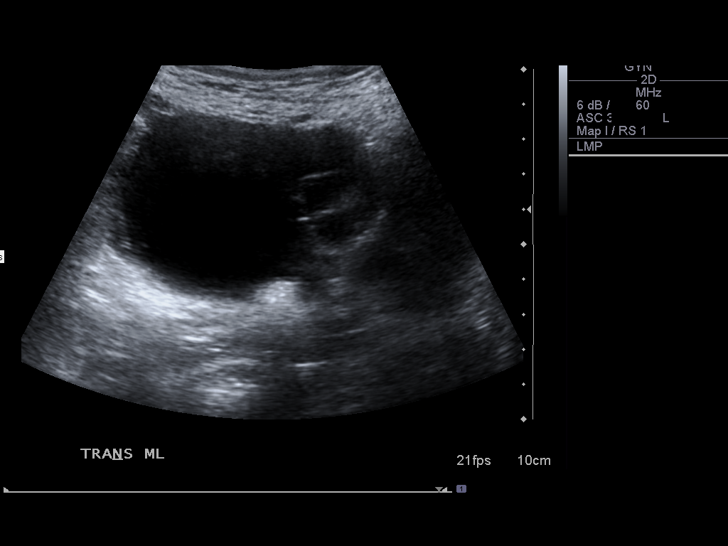
[im 23/92]
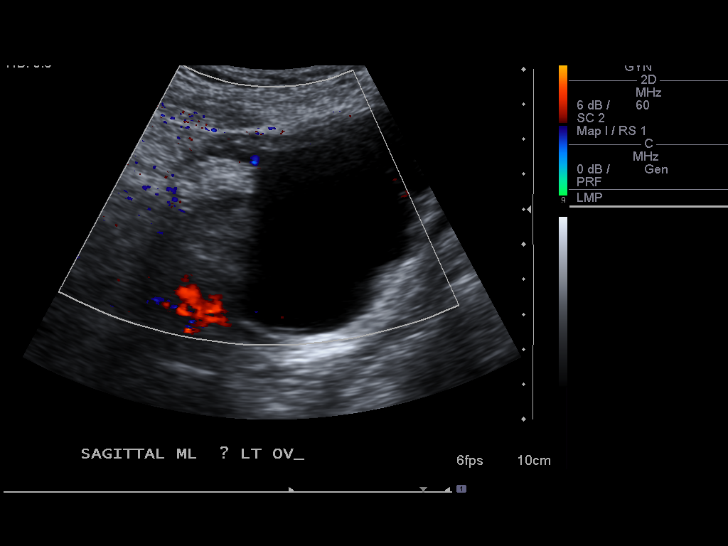
[im 31/92]
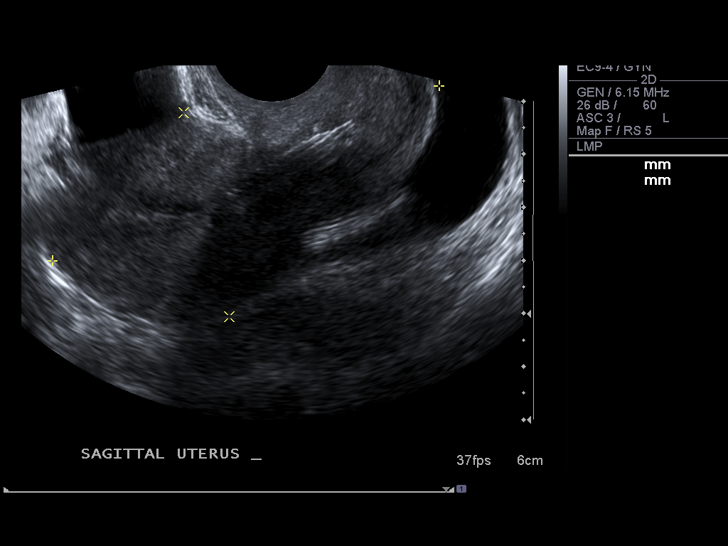
[im 38/92]
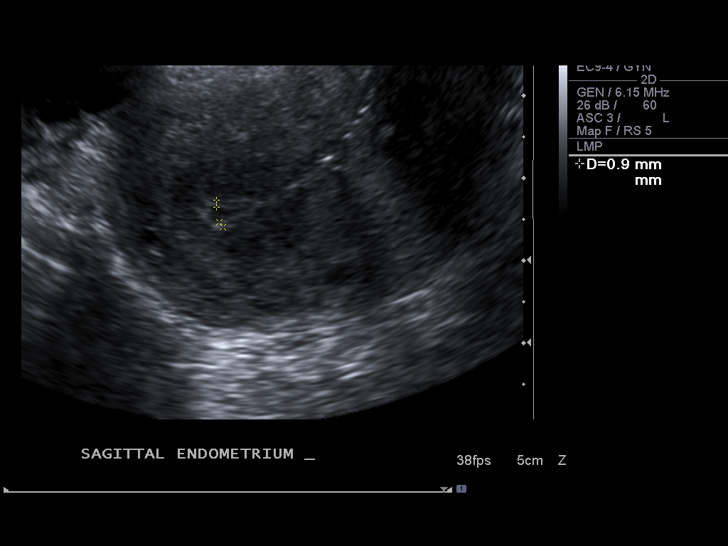
[im 46/92]
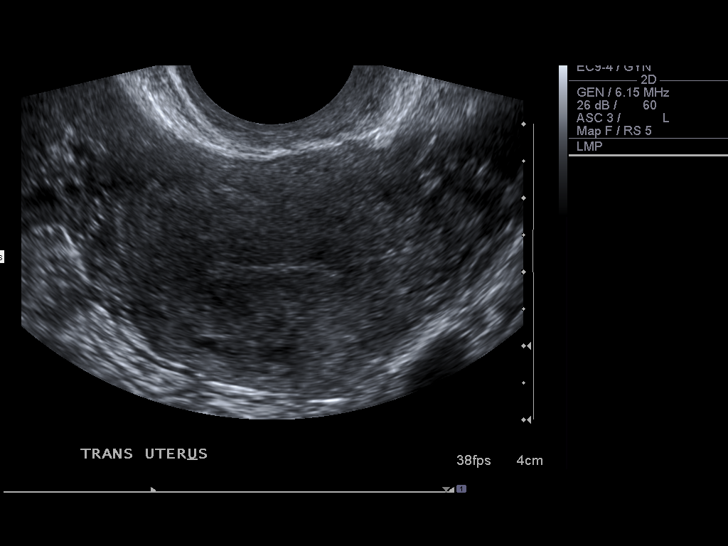
[im 54/92]
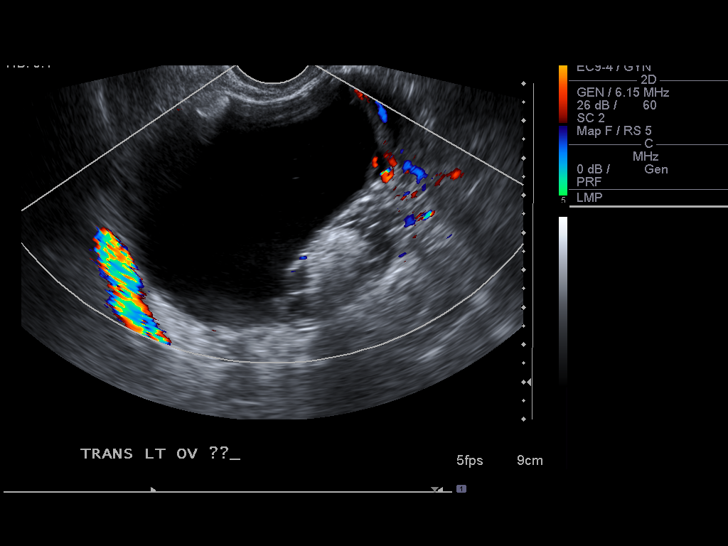
[im 61/92]
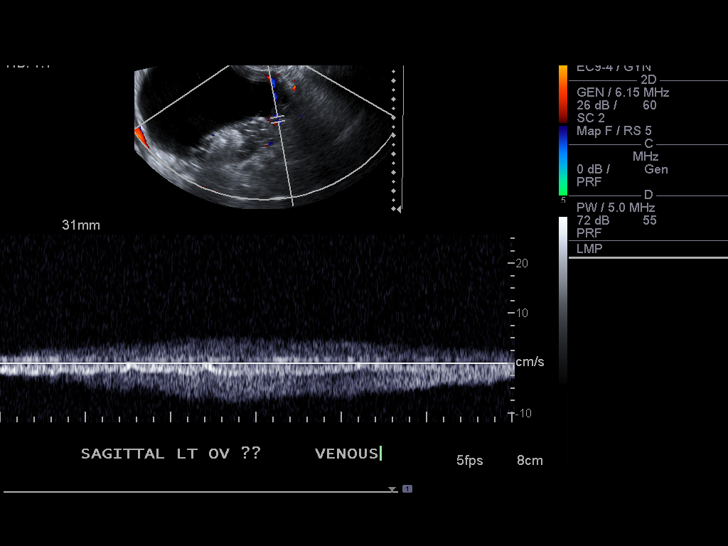
[im 69/92]
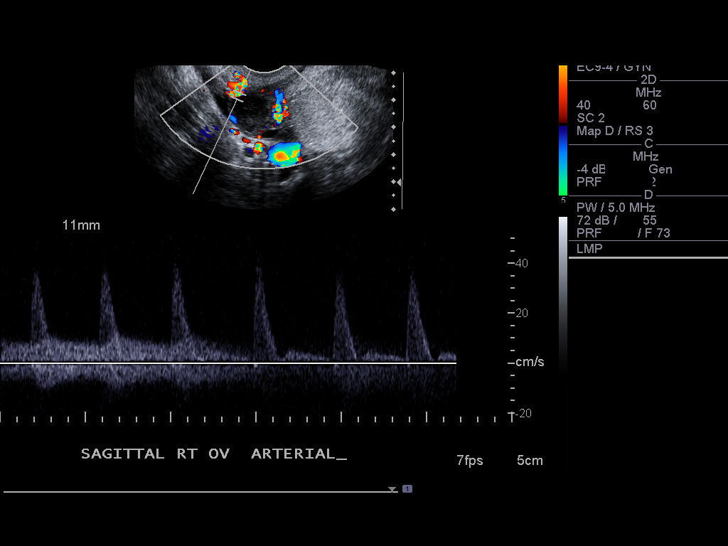
[im 76/92]
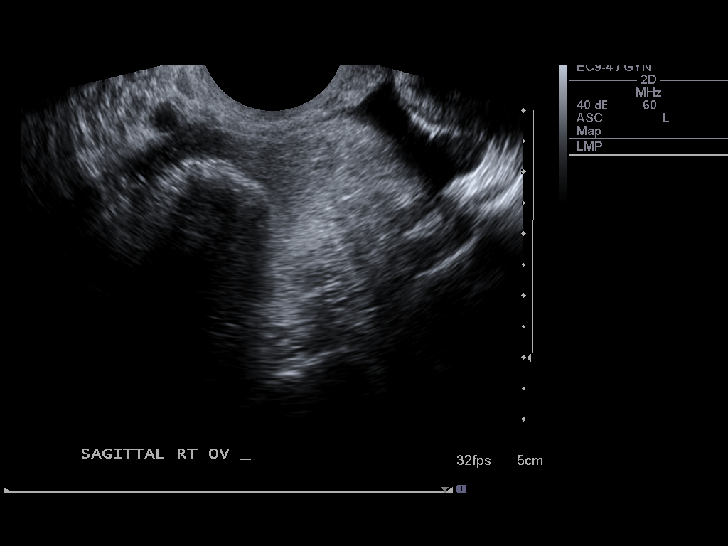
[im 84/92]
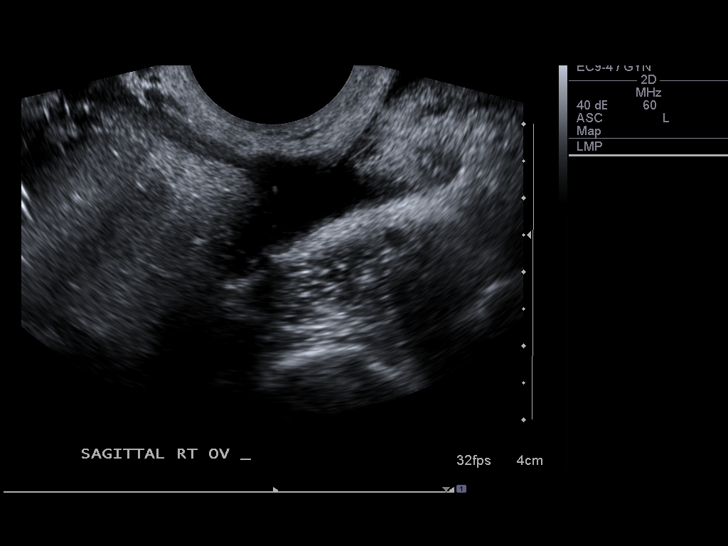
[im 92/92]
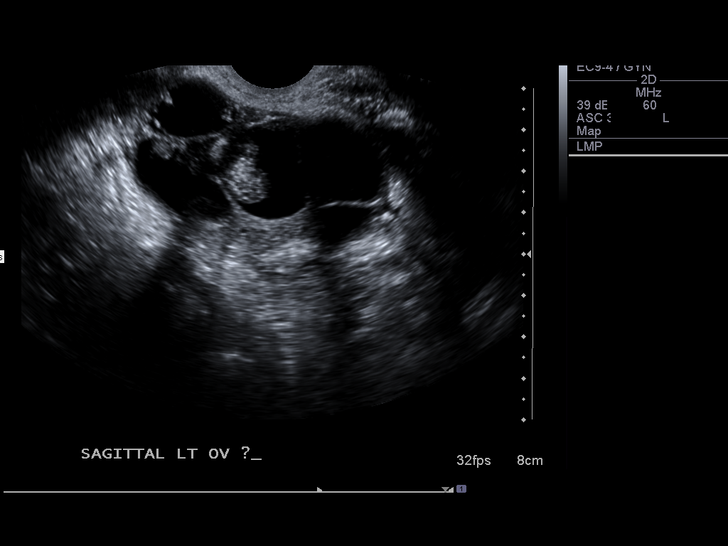

[13 of 25 positions shown; findings below may reference images not displayed]

FINDINGS: Uterus:  Normal in size and appearance.  Measures 8.0 x 3.9 x
cm.  Is anteverted.  Appears within normal limits for echogenicity.
No focal mass is identified.

Endometrium:  An intrauterine device is seen within the endometrial
canal.  Endometrial thickness is 1.6 mm

Right ovary: Measures 5.9 x 3.3 x 5.9 cm and contains a 4.6 x 3.5 x
4.0 9 cm echogenic area consistent with internal fat, as seen on
CT.  Focal calcification with associated shadowing is noted.  There
are also several normal-appearing follicles within the right ovary.
Color Doppler flow with arterial and venous waveforms are seen
within the portion of the right ovary that contains normal
follicles.

Left ovary: The left ovary is positioned within the anterior
midline pelvis and is replaced by a large complex predominately
cystic mass that measures 8.1 x 7.2 x 7.7 cm.  Within the left
ovary is a focal echogenic area measuring 3.5 x 2.7 cm consistent
with focal fat seen on CT.  Coursing through the cystic portion of
the mass are multiple somewhat irregular septations, which
demonstrate definite vascular flow on color Doppler imaging.  In
the anterior aspect of the mass there are several small echogenic
irregular nodules which may reflect additional foci of fat.  No
definite rim of normal appearing ovarian tissue is seen.  Arterial
and venous waveforms are seen within the peripheral soft tissue
component of the ovarian mass.
IMPRESSION: 1.  The left ovary is replaced by an 8 cm complex cystic mass.  The
possibility of malignant transformation of an ovarian
dermoid/teratoma cannot be excluded given the marked interval
enlargement of the mass since the CT August 2008, and the
development of multiple internal septations which contain vascular
flow and are somewhat nodular.  Gynecology consultation is
recommended. This finding was called to Vy Rabinowitz, PA, in the
[HOSPITAL] [DATE] on 12/25/2011.

2.  No definite evidence of ovarian torsion on the left.

3.  Right ovarian dermoid, without complicating features.

4.  Intrauterine device appears appropriately positioned.

## 2013-06-07 ENCOUNTER — Telehealth: Payer: Self-pay

## 2013-06-07 NOTE — Telephone Encounter (Signed)
I called and left a VM for patient that she is scheduled for an appointment with Dr. Pearlean Brownie on Sept. 12, 2014 at 2:30 p.m. We are not able to complete her DMV form until after her office visit.

## 2013-06-17 ENCOUNTER — Other Ambulatory Visit: Payer: Self-pay

## 2013-06-17 MED ORDER — LEVETIRACETAM 500 MG PO TABS: 500.0000 mg | ORAL_TABLET | Freq: Two times a day (BID) | ORAL | Status: DC

## 2013-06-21 ENCOUNTER — Telehealth: Payer: Self-pay | Admitting: Nurse Practitioner

## 2013-06-21 NOTE — Telephone Encounter (Signed)
Call pt about her appt. And the pt acknowledge that she was coming. °

## 2013-06-22 ENCOUNTER — Ambulatory Visit (INDEPENDENT_AMBULATORY_CARE_PROVIDER_SITE_OTHER): Payer: Medicaid Other | Admitting: Nurse Practitioner

## 2013-06-22 ENCOUNTER — Encounter: Payer: Self-pay | Admitting: Nurse Practitioner

## 2013-06-22 VITALS — BP 117/76 | HR 81 | Temp 98.9°F | Wt 156.0 lb

## 2013-06-22 DIAGNOSIS — R569 Unspecified convulsions: Secondary | ICD-10-CM | POA: Insufficient documentation

## 2013-06-22 MED ORDER — LEVETIRACETAM 500 MG PO TABS: 500.0000 mg | ORAL_TABLET | Freq: Two times a day (BID) | ORAL | Status: DC

## 2013-06-22 NOTE — Progress Notes (Signed)
[facility]  PATIENT: Laura Tran DOB: 11/04/1985   REASON FOR VISIT: follow up, epilepsy HISTORY FROM: patient  HISTORY OF PRESENT ILLNESS: UPDATE 06/22/13 (LL):  Laura Tran returns for follow up for epilepsy.  She reports she is doing well on Keppra 500 mg BID, no known side effects.  She reports a seizure in January of this year after getting inadequate sleep because child was sick and another seizure while visiting [place], she thinks she may have missed a dose of medication and was dehydrated.  She needs her DMV forms submitted to the [facility], states she had sent them in weeks ago.  UPDATE 01/06/12:  Laura Tran is a 27 year old, right handed, Caucasian female with history of primary generalized epilepsy. She has history of generalized seizures with an epileptiform EEG likely from idiopathic primary generalized epilepsy.  She was originally prescribed Lamictal, but developed a rash and was transitioned to Keppra.  She is here today for follow up since last visit 08/14/10.  She is taking Keppra 500mg  2 tabs in the morning, she has been taking this dose she says since 2011.  She had a breakthrough seizure 12/04/10 and 07/05/11 and she relates these to her missing doses.  Her most recent seizure was 8/12 and relates it to missing a dose.  She reports she has been more irritable.  She reports she is having difficulty with falling asleep, most nights.   She is scheduled for surgery for cysts on her ovaries with a planned left oophorectomy on 01/10/12.    She went to renew her driver's license and because of her epilepsy the DMV has given her a form for [place] to complete, she does not have with her today.  She believes her driver's license is suspended.    DATABASE:  Laura Tran is a 27 year old, right handed, Caucasian female with history of primary generalized epilepsy. She has  history of generalized seizures with an epileptiform EEG likely from idiopathic primary generalized  epilepsy.  She was originally prescribed Lamictal, but developed a rash and was transitioned to Keppra.  There is some question that the rash might have been related to Naproxen but nevertheless, Lamictal was stopped.  She had a breakthrough seizure on 09/11/2007 when she missed a dose of Keppra.  Her dose was decreased from 500mg  BID to 250mg  BID when she became pregnant.  She was off keppra since feb 2009.    Review of Systems  Out of a complete 14 system review, the patient complains of only the following symptoms, and all other reviewed systems are negative.  Neurologic: seizure    ALLERGIES: Allergies  Allergen Reactions  . Penicillins     Doesn't know reaction? Childhood reaction  . Amoxicillin Rash    HOME MEDICATIONS: Outpatient Prescriptions Prior to Visit  Medication Sig Dispense Refill  . levETIRAcetam (KEPPRA) 500 MG tablet Take 1 tablet (500 mg total) by mouth 2 (two) times daily.  60 tablet  0   No facility-administered medications prior to visit.    PAST MEDICAL HISTORY: Past Medical History  Diagnosis Date  . Seizures     last seizure was 05/2011    PAST SURGICAL HISTORY: Past Surgical History  Procedure Laterality Date  . Cesarean section      x 2  . Neck surgery  1997  . Laparotomy  01/10/2012    Procedure: EXPLORATORY LAPAROTOMY;  Surgeon: Mickel Baas, MD;  Location: [facility];  Service: Gynecology;  Laterality: N/A;  right ovarian cystectomy; left oophorectomy    FAMILY HISTORY: Family History  Problem Relation Age of Onset  . Breast cancer Maternal Grandmother   . Breast cancer Maternal Grandfather     SOCIAL HISTORY: History   Social History  . Marital Status: Married    Spouse Name: N/A    Number of Children: 2  . Years of Education: N/A   Occupational History  . Not on file.   Social History Main Topics  . Smoking status: Never Smoker   . Smokeless tobacco: Not on file  . Alcohol Use: No  . Drug Use: No  . Sexual Activity: Yes     Birth Control/ Protection: IUD   Other Topics Concern  . Not on file   Social History Narrative  . No narrative on file     PHYSICAL EXAM  Filed Vitals:   06/22/13 1421  BP: 117/76  Pulse: 81  Temp: 98.9 F (37.2 C)  TempSrc: Oral  Weight: 156 lb (70.761 kg)   Body mass index is 25.96 kg/(m^2).  Generalized: Well developed, in no acute distress  Head: normocephalic and atraumatic. Oropharynx benign  Neck: Supple, no carotid bruits  Cardiac: Regular rate rhythm, no murmur  Musculoskeletal: No deformity   Neurological examination   Mentation: Alert oriented to time, place, history taking. Follows all commands speech and language fluent Cranial nerve II-XII: Fundoscopic exam reveals sharp disc margins.Pupils were equal round reactive to light extraocular movements were full, visual field were full on confrontational test. Facial sensation and strength were normal. hearing was intact to finger rubbing bilaterally. Uvula tongue midline. head turning and shoulder shrug and were normal and symmetric.Tongue protrusion into cheek strength was normal. Motor: normal bulk and tone, full strength in the BUE, BLE, fine finger movements normal, no pronator drift. No focal weakness Sensory: normal and symmetric to light touch, pinprick, and  vibration  Coordination: finger-nose-finger, heel-to-shin bilaterally, no dysmetria Reflexes: Brachioradialis 2/2, biceps 2/2, triceps 2/2, patellar 2/2, Achilles 2/2, plantar responses were flexor bilaterally. Gait and Station: Rising up from seated position without assistance, normal stance, without trunk ataxia, moderate stride, good arm swing, smooth turning, able to perform tiptoe, and heel walking without difficulty.   DIAGNOSTIC DATA (LABS, IMAGING, TESTING) - I reviewed patient records, labs, notes, testing and imaging myself where available.  07/31/07 MRI BRAIN WITHOUT CONTRAST:  No evidence of acute ischemia. Two foci of hyperintense signal,  one involving the right putamen and the other in the left anterior temporal region below the sylvian fissure. Differential considerations at this age would be areas of possible gliosis, which could have a traumatic versus vascular or infectious etiology. Neoplastic process is felt to be most likely. Comparison to the previous studies, MR especially is suggested. Follow-up MRI scan with and without contrast is suggested in 3-6 months to establish stability of the findings described above.  ASSESSMENT AND PLAN  27 y.o. year old right-handed Caucasian female  has a past medical history of Epilepsy and Anxiety here with epilepsy.  She reports 2 breakthrough tonic-clonic seizures since last visit, attributed to inadequate sleep and missed dose of medication.  PLAN: 1. Continue Keppra 500 mg BID. 2. Patient advised that [place] states that she may not drive until after 6 months from last seizure. Last seizure was March 13, 2013. 3. Follow up in 1 year.  Meds ordered this encounter  Medications  . levETIRAcetam (KEPPRA) 500 MG tablet    Sig: Take 1 tablet (500 mg total) by  mouth 2 (two) times daily.    Dispense:  60 tablet    Refill:  11    Order Specific Question:  Supervising Provider    Answer:  Patterson Hammersmith Xylah Early, MSN, NP-C 06/22/2013, 3:33 PM [facility] [address] [place], [place] [address] [phone]

## 2013-06-22 NOTE — Patient Instructions (Addendum)
Continue current Keppra dose, refills sent to pharmacy.  No need for repeat imaging at this time.  Follow up in 1 year.Epilepsy A seizure (convulsion) is a sudden change in brain function that causes a change in behavior, muscle activity, or ability to remain awake and alert. If a person has recurring seizures, this is called epilepsy. CAUSES  Epilepsy is a disorder with many possible causes. Anything that disturbs the normal pattern of brain cell activity can lead to seizures. Seizure can be caused from illness to brain damage to abnormal brain development. Epilepsy may develop because of:  An abnormality in brain wiring.  An imbalance of nerve signaling chemicals (neurotransmitters).  Some combination of these factors. Scientists are learning an increasing amount about genetic causes of seizures. SYMPTOMS  The symptoms of a seizure can vary greatly from one person to another. These may include:  An aura, or warning that tells a person they are about to have a seizure.  Abnormal sensations, such as abnormal smell or seeing flashing lights.  Sudden, general body stiffness.  Rhythmic jerking of the face, arm, or leg  on one or both sides.  Sudden change in consciousness.  The person may appear to be awake but not responding.  They may appear to be asleep but cannot be awakened.  Grimacing, chewing, lip smacking, or drooling.  Often there is a period of sleepiness after a seizure. DIAGNOSIS  The description you give to your caregiver about what you experienced will help them understand your problems. Equally important is the description by any witnesses to your seizure. A physical exam, including a detailed neurological exam, is necessary. An EEG (electroencephalogram) is a painless test of your brain waves. In this test a diagram is created of your brain waves. These diagrams can be interpreted by a specialist. Pictures of your brain are usually taken with:  An MRI.  A CT  scan. Lab tests may be done to look for:  Signs of infection.  Abnormal blood chemistry. PREVENTION  There is no way to prevent the development of epilepsy. If you have seizures that are typically triggered by an event (such as flashing lights), try to avoid the trigger. This can help you avoid a seizure.  PROGNOSIS  Most people with epilepsy lead outwardly normal lives. While epilepsy cannot currently be cured, for some people it does eventually go away. Most seizures do not cause brain damage. It is not uncommon for people with epilepsy, especially children, to develop behavioral and emotional problems. These problems are sometimes the consequence of medicine for seizures or social stress. For some people with epilepsy, the risk of seizures restricts their independence and recreational activities. For example, some states refuse drivers licenses to people with epilepsy. Most women with epilepsy can become pregnant. They should discuss their epilepsy and the medicine they are taking with their caregivers. Women with epilepsy have a 90 percent or better chance of having a normal, healthy baby. RISKS AND COMPLICATIONS  People with epilepsy are at increased risk of falls, accidents, and injuries. People with epilepsy are at special risk for two life-threatening conditions. These are status epilepticus and sudden unexplained death (extremely rare). Status epilepticus is a long lasting, continuous seizure that is a medical emergency. TREATMENT  Once epilepsy is diagnosed, it is important to begin treatment as soon as possible. For about 80 percent of those diagnosed with epilepsy, seizures can be controlled with modern medicines and surgical techniques. Some antiepileptic drugs can interfere with the effectiveness of  oral contraceptives. In 1997, the FDA approved a pacemaker for the brain the (vagus nerve stimulator). This stimulator can be used for people with seizures that are not well-controlled by  medicine. Studies have shown that in some cases, children may experience fewer seizures if they maintain a strict diet. The strict diet is called the ketogenic diet. This diet is rich in fats and low in carbohydrates. HOME CARE INSTRUCTIONS   Your caregiver will make recommendations about driving and safety in normal activities. Follow these carefully.  Take any medicine prescribed exactly as directed.  Do any blood tests requested to monitor the levels of your medicine.  The people you live and work with should know that you are prone to seizures. They should receive instructions on how to help you. In general, a witness to a seizure should:  Cushion your head and body.  Turn you on your side.  Avoid unnecessarily restraining you.  Not place anything inside your mouth.  Call for local emergency medical help if there is any question about what has occurred.  Keep a seizure diary. Record what you recall about any seizure, especially any possible trigger.  If your caregiver has given you a follow-up appointment, it is very important to keep that appointment. Not keeping the appointment could result in permanent injury and disability. If there is any problem keeping the appointment, you must call back to this facility for assistance. SEEK MEDICAL CARE IF:   You develop signs of infection or other illness. This might increase the risk of a seizure.  You seem to be having more frequent seizures.  Your seizure pattern is changing. SEEK IMMEDIATE MEDICAL CARE IF:   A seizure does not stop after a few moments.  A seizure causes any difficulty in breathing.  A seizure results in a very severe headache.  A seizure leaves you with the inability to speak or use a part of your body. MAKE SURE YOU:   Understand these instructions.  Will watch your condition.  Will get help right away if you are not doing well or get worse. Document Released: 09/27/2005 Document Revised: 12/20/2011  Document Reviewed: 05/03/2008 [facility] Patient Information 2014 [place], [place].

## 2013-12-18 ENCOUNTER — Telehealth: Payer: Self-pay | Admitting: *Deleted

## 2013-12-18 NOTE — Telephone Encounter (Signed)
Patient called back stating that she needed a letter written to the [facility] stating her last seizure. I explained to the patient that there will be a fee. I advised the patient that she could get a copy of her LOV and send to [facility]. I printed off the LOV and left it up front for the patient. I advised the patient that if she has any other problems, questions or concerns to call the office. Patient verbalized understanding.

## 2014-04-26 DIAGNOSIS — Z0289 Encounter for other administrative examinations: Secondary | ICD-10-CM

## 2014-06-24 ENCOUNTER — Ambulatory Visit: Payer: Medicaid Other | Admitting: Nurse Practitioner

## 2014-06-24 ENCOUNTER — Telehealth: Payer: Self-pay | Admitting: Nurse Practitioner

## 2014-06-24 NOTE — Telephone Encounter (Signed)
Patient was no show for today's office appointment.  

## 2014-07-01 ENCOUNTER — Encounter: Payer: Self-pay | Admitting: Nurse Practitioner

## 2014-11-26 ENCOUNTER — Other Ambulatory Visit: Payer: Self-pay | Admitting: Nurse Practitioner

## 2014-11-27 NOTE — Telephone Encounter (Signed)
Patient has not been seen in over 1 year.  No showed last appt.  I called primary number multiple times, getting a busy signal.  I called alternate number, got no answer.  Left message.

## 2014-11-29 NOTE — Telephone Encounter (Signed)
I called patient again.  Got no answer. n primary number.  Left message on secondary number.  I called the pharmacy.  Spoke with Joie.  She said they did speak with the patient's spouse this morning and he is aware patient needs to schedule appt.

## 2014-12-03 NOTE — Telephone Encounter (Signed)
I called the patient again.  Got no answer.  Left message.

## 2015-01-23 ENCOUNTER — Other Ambulatory Visit: Payer: Self-pay

## 2015-01-24 LAB — CYTOLOGY - PAP

## 2015-07-01 ENCOUNTER — Emergency Department (HOSPITAL_COMMUNITY)
Admission: EM | Admit: 2015-07-01 | Discharge: 2015-07-01 | Disposition: A | Discharge status: Home / Self Care | Payer: BLUE CROSS/BLUE SHIELD | Attending: Emergency Medicine | Admitting: Emergency Medicine

## 2015-07-01 ENCOUNTER — Encounter (HOSPITAL_COMMUNITY): Payer: Self-pay | Admitting: Emergency Medicine

## 2015-07-01 DIAGNOSIS — G40909 Epilepsy, unspecified, not intractable, without status epilepticus: Secondary | ICD-10-CM | POA: Insufficient documentation

## 2015-07-01 DIAGNOSIS — R569 Unspecified convulsions: Secondary | ICD-10-CM | POA: Diagnosis present

## 2015-07-01 DIAGNOSIS — Z880 Allergy status to penicillin: Secondary | ICD-10-CM | POA: Diagnosis not present

## 2015-07-01 MED ORDER — LEVETIRACETAM 500 MG PO TABS: 500.0000 mg | ORAL_TABLET | Freq: Two times a day (BID) | ORAL | Status: DC

## 2015-07-01 NOTE — ED Provider Notes (Signed)
CSN: [id number]     Arrival date & time 07/01/15  1653 History   First MD Initiated Contact with Patient 07/01/15 1730     Chief Complaint  Patient presents with  . Seizures     (Consider location/radiation/quality/duration/timing/severity/associated sxs/prior Treatment) Patient is a 29 y.o. female presenting with seizures. The history is provided by the patient and the spouse.  Seizures   Laura Tran is a 29 y.o. female who presents for evaluation of seizure. The seizure was witnessed by other parents, while she was at her daughter's dance class. They told EMS that she had generalized shaking, and had decreased responsiveness. On arrival of EMS, she was postictal. She has since recovered, her full sensorium. She denies localized pain, but has generalized achiness. She denies headache, neck pain or back pain. No recent illnesses. She has similar seizure, 3 weeks ago. She stopped taking her Keppra 2 years ago, because she had been seizure free, for 2 years. She has not seen her neurologist recently, and does not have any additional medications at this time. There are no other known modifying factors.    Past Medical History  Diagnosis Date  . Seizures     last seizure was 05/2011   Past Surgical History  Procedure Laterality Date  . Cesarean section      x 2  . Neck surgery  1997  . Laparotomy  01/10/2012    Procedure: EXPLORATORY LAPAROTOMY;  Surgeon: Sharene Butters, MD;  Location: [facility];  Service: Gynecology;  Laterality: N/A;  right ovarian cystectomy; left oophorectomy   Family History  Problem Relation Age of Onset  . Breast cancer Maternal Grandmother   . Breast cancer Maternal Grandfather    Social History  Substance Use Topics  . Smoking status: Never Smoker   . Smokeless tobacco: None  . Alcohol Use: No   OB History    No data available     Review of Systems  Neurological: Positive for seizures.  All other systems reviewed and are  negative.     Allergies  Penicillins and Amoxicillin  Home Medications   Prior to Admission medications   Medication Sig Start Date End Date Taking? Authorizing Provider  ALPRAZolam Duanne Moron) 1 MG tablet Take 1 mg by mouth 3 (three) times daily as needed for anxiety.    Historical Provider, MD  levETIRAcetam (KEPPRA) 500 MG tablet Take 1 tablet (500 mg total) by mouth 2 (two) times daily. 07/01/15   Daleen Bo, MD   BP 113/76 mmHg  Pulse 105  Temp(Src) 99 F (37.2 C)  Resp 18  Ht 5\' 6"  (1.676 m)  Wt 140 lb (63.504 kg)  BMI 22.61 kg/m2  SpO2 99%  LMP 06/22/2015 (Approximate) Physical Exam  Constitutional: She is oriented to person, place, and time. She appears well-developed and well-nourished.  HENT:  Head: Normocephalic and atraumatic.  Right Ear: External ear normal.  Left Ear: External ear normal.  No lingual laceration or abrasion  Eyes: Conjunctivae and EOM are normal. Pupils are equal, round, and reactive to light.  Neck: Normal range of motion and phonation normal. Neck supple.  Cardiovascular: Normal rate, regular rhythm and normal heart sounds.   Pulmonary/Chest: Effort normal and breath sounds normal. She exhibits no bony tenderness.  Abdominal: Soft. There is no tenderness.  Musculoskeletal: Normal range of motion.  Neurological: She is alert and oriented to person, place, and time. No cranial nerve deficit or sensory deficit. She exhibits normal muscle tone. Coordination normal.  No  dysarthria, aphasia or nystagmus  Skin: Skin is warm, dry and intact.  Psychiatric: She has a normal mood and affect. Her behavior is normal. Judgment and thought content normal.  Nursing note and vitals reviewed.   ED Course  Procedures (including critical care time)   The patient is interested in starting her Keppra again. She plans on following up with her neurologist again. She understands that she is not to drive, or put herself in a position where she could injure herself  while having seizure, to cleared by her neurologist.  Labs Review Labs Reviewed - No data to display  Imaging Review No results found. I have personally reviewed and evaluated these images and lab results as part of my medical decision-making.   EKG Interpretation None      MDM   Final diagnoses:  Seizure    Epilepsy, with recurrent seizures, while off medications. No evidence for other inciting causes. This respect to infection, metabolic instability or impending vascular collapse.  Nursing Notes Reviewed/ Care Coordinated Applicable Imaging Reviewed Interpretation of Laboratory Data incorporated into ED treatment  The patient appears reasonably screened and/or stabilized for discharge and I doubt any other medical condition or other [facility] requiring further screening, evaluation, or treatment in the ED at this time prior to discharge.  Plan: Home Medications- Keppra; Home Treatments- rest; return here if the recommended treatment, does not improve the symptoms; Recommended follow up- PCP prn    Daleen Bo, MD 07/01/15 [phone]

## 2015-07-01 NOTE — Discharge Instructions (Signed)

## 2015-07-01 NOTE — ED Notes (Signed)
Per EMS   BP: 131/94  Pulse: 117  O2 sat: 100%  CBG: 87

## 2015-07-01 NOTE — ED Notes (Addendum)
Pt brought to ED via [facility], for report of seizure. Patient states that she was at her daughters dance class and she had a seizure. Patient has a hx/o seizures and stopped her Keppra 2 years ago. Per pt report she also had a seizure 3 weeks ago.  Patient is c/o nausea.

## 2015-07-01 NOTE — ED Notes (Signed)
Per Registration, patient wanting to know when she can leave.

## 2015-07-29 ENCOUNTER — Encounter: Payer: Self-pay | Admitting: Diagnostic Neuroimaging

## 2015-07-29 ENCOUNTER — Ambulatory Visit (INDEPENDENT_AMBULATORY_CARE_PROVIDER_SITE_OTHER): Payer: BLUE CROSS/BLUE SHIELD | Admitting: Diagnostic Neuroimaging

## 2015-07-29 VITALS — BP 107/74 | HR 74 | Ht 65.0 in | Wt 165.0 lb

## 2015-07-29 DIAGNOSIS — G40909 Epilepsy, unspecified, not intractable, without status epilepticus: Secondary | ICD-10-CM | POA: Diagnosis not present

## 2015-07-29 DIAGNOSIS — R930 Abnormal findings on diagnostic imaging of skull and head, not elsewhere classified: Secondary | ICD-10-CM

## 2015-07-29 DIAGNOSIS — R9089 Other abnormal findings on diagnostic imaging of central nervous system: Secondary | ICD-10-CM

## 2015-07-29 MED ORDER — LEVETIRACETAM 500 MG PO TABS: 500.0000 mg | ORAL_TABLET | Freq: Two times a day (BID) | ORAL | Status: DC

## 2015-07-29 NOTE — Patient Instructions (Signed)
Thank you for coming to see [place] at [facility]. I hope we have been able to provide you high quality care today.  You may receive a patient satisfaction survey over the next few weeks. We would appreciate your feedback and comments so that we may continue to improve ourselves and the health of our patients.  - continue levetiracetam 564m twice a day - no driving until seizure free x 6 months   ~~~~~~~~~~~~~~~~~~~~~~~~~~~~~~~~~~~~~~~~~~~~~~~~~~~~~~~~~~~~~~~~~  DR. Arend Bahl'S GUIDE TO HAPPY AND HEALTHY LIVING These are some of my general health and wellness recommendations. Some of them may apply to you better than others. Please use common sense as you try these suggestions and feel free to ask me any questions.   ACTIVITY/FITNESS Mental, social, emotional and physical stimulation are very important for brain and body health. Try learning a new activity (arts, music, language, sports, games).  Keep moving your body to the best of your abilities. You can do this at home, inside or outside, the park, community center, gym or anywhere you like. Consider a physical therapist or personal trainer to get started. Consider the app Sworkit. Fitness trackers such as smart-watches, smart-phones or Fitbits can help as well.   NUTRITION Eat more plants: colorful vegetables, nuts, seeds and berries.  Eat less sugar, salt, preservatives and processed foods.  Avoid toxins such as cigarettes and alcohol.  Drink water when you are thirsty. Warm water with a slice of lemon is an excellent morning drink to start the day.  Consider these websites for more information The Nutrition Source (hhttps://www.henry-hernandez.biz/ Precision Nutrition (wWindowBlog.ch   RELAXATION Consider practicing mindfulness meditation or other relaxation techniques such as deep breathing, prayer, yoga, tai chi, massage. See website mindful.org or the apps  Headspace or Calm to help get started.   SLEEP Try to get at least 7-8+ hours sleep per day. Regular exercise and reduced caffeine will help you sleep better. Practice good sleep hygeine techniques. See website sleep.org for more information.   PLANNING Prepare estate planning, living will, healthcare POA documents. Sometimes this is best planned with the help of an attorney. Theconversationproject.org and agingwithdignity.org are excellent resources.

## 2015-07-29 NOTE — Progress Notes (Signed)
[facility]  PATIENT: Laura Tran DOB: 1985-11-04  REFERRING CLINICIAN: Eulis Foster HISTORY FROM: patient  REASON FOR VISIT: new consult    HISTORICAL  CHIEF COMPLAINT:  Chief Complaint  Patient presents with  . Seizures    rm 6, past patient of Dr Leonie Man    HISTORY OF PRESENT ILLNESS:   29 year old right-handed female here for evaluation of seizure disorder.  Age 23 years old patient was diagnosed with febrile seizure. She was not treated with antiseizure medication.  Second seizure occurred age 54 years old around 2007. At that time she was started on lamotrigine which causes a rash. She was transitioned to levetiracetam. She had additional seizure in 2008 when she missed a dose.  2009 at 2011 patient was off of medication as she was pregnant 2.  Patient had returned to taking levetiracetam around 2011. She had break through seizures in September 2011, however 2012, August 2012 and January 2014. September 2015 patient came off of medication as she lost her insurance. She remained seizure free until 07/01/2015. Gen. typical seizure. No warning sensation. She had some nervous sensation and feeling, froze, fell to the ground had generalized convulsions. She lost control of urine. No tongue biting. She had some amnesia and confusion afterwards. Patient went to the emergency room and has been restarted on antiseizure medication. She's been taking levetiracetam 500 mg daily instead of twice a day as prescribed.  All of patient's seizures have consisted of generalized convulsions or loss of consciousness. She has never had staring spells. Never had partial onset seizures.   REVIEW OF SYSTEMS: Full 14 system review of systems performed and notable only for weight gain fatigue racing thoughts.  ALLERGIES: Allergies  Allergen Reactions  . Penicillins     Doesn't know reaction? Childhood reaction  . Amoxicillin Rash    HOME MEDICATIONS: Outpatient Prescriptions  Prior to Visit  Medication Sig Dispense Refill  . levETIRAcetam (KEPPRA) 500 MG tablet Take 1 tablet (500 mg total) by mouth 2 (two) times daily. 60 tablet 11  . ALPRAZolam (XANAX) 1 MG tablet Take 1 mg by mouth 3 (three) times daily as needed for anxiety.     No facility-administered medications prior to visit.    PAST MEDICAL HISTORY: Past Medical History  Diagnosis Date  . Seizures ([place])     last seizure 07/01/15    PAST SURGICAL HISTORY: Past Surgical History  Procedure Laterality Date  . Cesarean section      x 2  . Neck surgery  1997    exploratory  . Laparotomy  01/10/2012    Procedure: EXPLORATORY LAPAROTOMY;  Surgeon: Sharene Butters, MD;  Location: [facility];  Service: Gynecology;  Laterality: N/A;  right ovarian cystectomy; left oophorectomy    FAMILY HISTORY: Family History  Problem Relation Age of Onset  . Breast cancer Maternal Grandmother   . Breast cancer Maternal Grandfather   . Seizures Paternal Uncle     febrile  . Healthy Mother   . Healthy Father     SOCIAL HISTORY:  Social History   Social History  . Marital Status: Married    Spouse Name: Vonna Kotyk  . Number of Children: 2  . Years of Education: 14   Occupational History  .      homemaker   Social History Main Topics  . Smoking status: Never Smoker   . Smokeless tobacco: Not on file  . Alcohol Use: Yes     Comment: 1-2 glasses wine a week  .  Drug Use: No  . Sexual Activity: Yes    Birth Control/ Protection: IUD   Other Topics Concern  . Not on file   Social History Narrative   Lives at home with husband, children   Caffeine use- sodas 1-2/day, tea 2 x week     PHYSICAL EXAM  GENERAL EXAM/CONSTITUTIONAL: Vitals:  Filed Vitals:   07/29/15 1113  BP: 107/74  Pulse: 74  Height: 5' 5"  (1.651 m)  Weight: 165 lb (74.844 kg)     Body mass index is 27.46 kg/(m^2).  No exam data present  Patient is in no distress; well developed, nourished and groomed; neck is  supple  CARDIOVASCULAR:  Examination of carotid arteries is normal; no carotid bruits  Regular rate and rhythm, no murmurs  Examination of peripheral vascular system by observation and palpation is normal  EYES:  Ophthalmoscopic exam of optic discs and posterior segments is normal; no papilledema or hemorrhages  MUSCULOSKELETAL:  Gait, strength, tone, movements noted in Neurologic exam below  NEUROLOGIC: MENTAL STATUS:  No flowsheet data found.  awake, alert, oriented to person, place and time  recent and remote memory intact  normal attention and concentration  language fluent, comprehension intact, naming intact,   fund of knowledge appropriate  CRANIAL NERVE:   2nd - no papilledema on fundoscopic exam  2nd, 3rd, 4th, 6th - pupils equal and reactive to light, visual fields full to confrontation, extraocular muscles intact, no nystagmus  5th - facial sensation symmetric  7th - facial strength symmetric  8th - hearing intact  9th - palate elevates symmetrically, uvula midline  11th - shoulder shrug symmetric  12th - tongue protrusion midline  MOTOR:   normal bulk and tone, full strength in the BUE, BLE  SENSORY:   normal and symmetric to light touch, pinprick, temperature, vibration  COORDINATION:   finger-nose-finger, fine finger movements  normal  REFLEXES:   deep tendon reflexes present and symmetric  GAIT/STATION:   narrow based gait; able to walk tandem; romberg is negative    DIAGNOSTIC DATA (LABS, IMAGING, TESTING) - I reviewed patient records, labs, notes, testing and imaging myself where available.  Lab Results  Component Value Date   WBC 11.8* 01/11/2012   HGB 9.6* 01/11/2012   HCT 28.3* 01/11/2012   MCV 89.0 01/11/2012   PLT 229 01/11/2012      Component Value Date/Time   NA 139 09/11/2008 0540   K 4.2 09/11/2008 0540   CL 105 09/11/2008 0540   CO2 28 09/11/2008 0540   GLUCOSE 85 09/11/2008 0540   BUN 1* 09/11/2008  0540   CREATININE 0.54 09/11/2008 0540   CALCIUM 8.4 09/11/2008 0540   PROT 4.3* 09/08/2008 0942   ALBUMIN 1.3* 09/08/2008 0942   AST 10 09/08/2008 0942   ALT <8 09/08/2008 0942   ALKPHOS 103 09/08/2008 0942   BILITOT 0.4 09/08/2008 0942   GFRNONAA >60 09/11/2008 0540   GFRAA  09/11/2008 0540    >60        The eGFR has been calculated using the MDRD equation. This calculation has not been validated in all clinical   No results found for: CHOL, HDL, LDLCALC, LDLDIRECT, TRIG, CHOLHDL No results found for: HGBA1C No results found for: VITAMINB12 Lab Results  Component Value Date   TSH 0.902 Test methodology is 3rd generation TSH 09/08/2008   08/02/07 MRI brain  1. No evidence of acute ischemia.  2. Two foci of hyperintense signal, one involving the right putamen and the other  in the left anterior temporal region below the sylvian fissure. Differential considerations at this age would be areas of possible gliosis, which could have a traumatic versus vascular or infectious etiology. Neoplastic process is felt to be most likely. Comparison to the previous studies, MR especially is suggested.  3. Follow-up MRI scan with and without contrast is suggested in 3-6 months to establish stability of the findings described above.  06/27/10 CT head [I reviewed images myself and agree with interpretation. -VRP]   - No skull fracture or intracranial hemorrhage. - The MR detected small foci of hyperintensity on prior MR scan are not adequately assessed by CT. Stability of these findings can be confirmed on elective follow-up MR.  07/14/10 EEG - generalized epileptiform discharges     ASSESSMENT AND PLAN  29 y.o. year old female here with seizure disorder since age 89 years old.  Dx: seizure disorder (? Primary generalized vs complex partial with secondary generalization)  Seizure disorder ([place]) - Plan: MR Brain W Wo Contrast  MRI of brain abnormal - Plan: MR Brain W Wo  Contrast    PLAN: - Increase levetiracetam up to 500 mg twice a day as prescribed - Follow-up MRI brain of abnormal foci of gliosis from 2008 study - No driving until seizure free 6 months  Orders Placed This Encounter  Procedures  . MR Brain W Wo Contrast   Meds ordered this encounter  Medications  . levETIRAcetam (KEPPRA) 500 MG tablet    Sig: Take 1 tablet (500 mg total) by mouth 2 (two) times daily.    Dispense:  180 tablet    Refill:  4   Return in about 3 months (around 10/29/2015).    Penni Bombard, MD 27/61/8485, 92:76 PM Certified in Neurology, Neurophysiology and Neuroimaging  [facility] [address], [place], [place] [address] [phone]

## 2015-07-30 ENCOUNTER — Telehealth: Payer: Self-pay | Admitting: Diagnostic Neuroimaging

## 2015-07-30 NOTE — Telephone Encounter (Signed)
Patient declined the MRI for now. She will confirm with spoue to see if she can afford the copay of $590.11.

## 2015-08-14 ENCOUNTER — Telehealth: Payer: Self-pay | Admitting: *Deleted

## 2015-08-14 NOTE — Telephone Encounter (Signed)
Form,DMV received from [facility] to [facility] and Dr Leta Baptist 08/14/15.

## 2015-08-15 DIAGNOSIS — Z0289 Encounter for other administrative examinations: Secondary | ICD-10-CM

## 2015-08-18 NOTE — Telephone Encounter (Signed)
DMV papers completed, signed, sent to MR to be scanned, processed.

## 2015-08-19 ENCOUNTER — Telehealth: Payer: Self-pay | Admitting: *Deleted

## 2015-08-19 NOTE — Telephone Encounter (Signed)
Form,DMV received,completed by Dr Leta Baptist and Leilani Able faxed, mailed patient a copy 08/19/15.

## 2015-11-05 ENCOUNTER — Ambulatory Visit: Payer: BLUE CROSS/BLUE SHIELD | Admitting: Diagnostic Neuroimaging

## 2015-11-06 ENCOUNTER — Encounter: Payer: Self-pay | Admitting: Diagnostic Neuroimaging

## 2016-11-23 ENCOUNTER — Other Ambulatory Visit: Payer: Self-pay | Admitting: Diagnostic Neuroimaging

## 2017-03-08 ENCOUNTER — Other Ambulatory Visit: Payer: Self-pay | Admitting: Diagnostic Neuroimaging

## 2017-03-08 NOTE — Telephone Encounter (Signed)
Left detailed VM advising patient that she was last seen 12/2014. Advised her Dr Leta Baptist gave her a one month refill in Feb but that she had to schedule follow up to receive more refills. Advised she call office and schedule follow up.

## 2017-05-20 LAB — OB RESULTS CONSOLE HEPATITIS B SURFACE ANTIGEN: Hepatitis B Surface Ag: NEGATIVE

## 2017-05-20 LAB — OB RESULTS CONSOLE ANTIBODY SCREEN: ANTIBODY SCREEN: NEGATIVE

## 2017-05-20 LAB — OB RESULTS CONSOLE RUBELLA ANTIBODY, IGM: Rubella: IMMUNE

## 2017-05-20 LAB — OB RESULTS CONSOLE ABO/RH: RH TYPE: NEGATIVE

## 2017-05-20 LAB — OB RESULTS CONSOLE RPR: RPR: NONREACTIVE

## 2017-05-20 LAB — OB RESULTS CONSOLE HIV ANTIBODY (ROUTINE TESTING): HIV: NONREACTIVE

## 2017-05-20 LAB — OB RESULTS CONSOLE GC/CHLAMYDIA
Chlamydia: NEGATIVE
Gonorrhea: NEGATIVE

## 2017-11-03 ENCOUNTER — Other Ambulatory Visit: Payer: Self-pay | Admitting: Obstetrics and Gynecology

## 2017-11-03 NOTE — H&P (Signed)
32 y.o.  No obstetric history on file. Unknown comes in for a repeat cesarean section at term.  Patient has good fetal movement and no bleeding.  Also desires sterilization.  Past Medical History:  Diagnosis Date  . Seizures ([place])    last seizure 07/01/15    Past Surgical History:  Procedure Laterality Date  . CESAREAN SECTION     x 2  . LAPAROTOMY  01/10/2012   Procedure: EXPLORATORY LAPAROTOMY;  Surgeon: Sharene Butters, MD;  Location: [facility];  Service: Gynecology;  Laterality: N/A;  right ovarian cystectomy; left oophorectomy  . NECK SURGERY  1997   exploratory    OB History  No data available    Social History   Socioeconomic History  . Marital status: Married    Spouse name: Vonna Kotyk  . Number of children: 2  . Years of education: 79  . Highest education level: Not on file  Social Needs  . Financial resource strain: Not on file  . Food insecurity - worry: Not on file  . Food insecurity - inability: Not on file  . Transportation needs - medical: Not on file  . Transportation needs - non-medical: Not on file  Occupational History    Comment: homemaker  Tobacco Use  . Smoking status: Never Smoker  Substance and Sexual Activity  . Alcohol use: Yes    Comment: 1-2 glasses wine a week  . Drug use: No  . Sexual activity: Yes    Birth control/protection: IUD  Other Topics Concern  . Not on file  Social History Narrative   Lives at home with husband, children   Caffeine use- sodas 1-2/day, tea 2 x week   Penicillins and Amoxicillin   Prenatal Course: uncomplicated.   Prenatal Transfer Tool  Maternal Diabetes: No Genetic Screening: Normal Maternal Ultrasounds/Referrals: Normal Fetal Ultrasounds or other Referrals:  None Maternal Substance Abuse:  No Significant Maternal Medications:  None Significant Maternal Lab Results: None  There were no vitals filed for this visit.  Lungs/Cor:  NAD Abdomen:  soft, gravid Ex:  no cords, erythema SVE:  NA FHTs:   present  A/P   For repeat cesarean sectionat term.  All risks, benefits and alternatives discussed with patient and she desires to proceed.  Also desires BTL.    Mindi Akerson A

## 2017-11-11 ENCOUNTER — Telehealth (HOSPITAL_COMMUNITY): Payer: Self-pay | Admitting: *Deleted

## 2017-11-11 ENCOUNTER — Encounter (HOSPITAL_COMMUNITY): Payer: Self-pay | Admitting: *Deleted

## 2017-11-11 NOTE — Telephone Encounter (Signed)
Preadmission screen  

## 2017-11-14 ENCOUNTER — Encounter (HOSPITAL_COMMUNITY): Payer: Self-pay

## 2017-11-15 ENCOUNTER — Telehealth (HOSPITAL_COMMUNITY): Payer: Self-pay | Admitting: *Deleted

## 2017-11-15 NOTE — Telephone Encounter (Signed)
Preadmission screen  

## 2017-11-16 ENCOUNTER — Encounter (HOSPITAL_COMMUNITY): Payer: Self-pay

## 2017-11-24 ENCOUNTER — Other Ambulatory Visit: Payer: Self-pay | Admitting: Obstetrics and Gynecology

## 2017-11-24 NOTE — Patient Instructions (Signed)
Laura Tran  11/24/2017   Your procedure is scheduled on:  11/26/2017  Enter through the Main Entrance of [facility] at [place] up the phone at the desk and dial [phone]  Call this number if you have problems the morning of surgery:[phone]  Remember:   Do not eat food:(After Midnight) Desps de medianoche.  Do not drink clear liquids: (After Midnight) Desps de medianoche.  Take these medicines the morning of surgery with A SIP OF WATER: none   Do not wear jewelry, make-up or nail polish.  Do not wear lotions, powders, or perfumes. Do not wear deodorant.  Do not shave 48 hours prior to surgery.  Do not bring valuables to the hospital.  [facility] is not   responsible for any belongings or valuables brought to the hospital.  Contacts, dentures or bridgework may not be worn into surgery.  Leave suitcase in the car. After surgery it may be brought to your room.  For patients admitted to the hospital, checkout time is 11:00 AM the day of              discharge.    N/A   Please read over the following fact sheets that you were given:   Surgical Site Infection Prevention

## 2017-11-25 ENCOUNTER — Encounter (HOSPITAL_COMMUNITY)
Admission: RE | Admit: 2017-11-25 | Discharge: 2017-11-25 | Disposition: A | Discharge status: Home / Self Care | Payer: BLUE CROSS/BLUE SHIELD | Source: Ambulatory Visit | Attending: Obstetrics and Gynecology | Admitting: Obstetrics and Gynecology

## 2017-11-25 ENCOUNTER — Encounter (HOSPITAL_COMMUNITY): Payer: Self-pay | Admitting: Anesthesiology

## 2017-11-25 HISTORY — DX: Encounter for other specified aftercare: Z51.89

## 2017-11-25 HISTORY — DX: Anemia, unspecified: D64.9

## 2017-11-25 LAB — CBC
HEMATOCRIT: 34.5 % — AB (ref 36.0–46.0)
HEMOGLOBIN: 11.4 g/dL — AB (ref 12.0–15.0)
MCH: 28.7 pg (ref 26.0–34.0)
MCHC: 33 g/dL (ref 30.0–36.0)
MCV: 86.9 fL (ref 78.0–100.0)
Platelets: 251 10*3/uL (ref 150–400)
RBC: 3.97 MIL/uL (ref 3.87–5.11)
RDW: 15.5 % (ref 11.5–15.5)
WBC: 10.5 10*3/uL (ref 4.0–10.5)

## 2017-11-25 NOTE — Anesthesia Preprocedure Evaluation (Addendum)
Anesthesia Evaluation  Patient identified by MRN, date of birth, ID band Patient awake    Reviewed: Allergy & Precautions, NPO status , Patient's Chart, lab work & pertinent test results  Airway Mallampati: II  TM Distance: >3 FB Neck ROM: Full    Dental no notable dental hx. (+) Teeth Intact   Pulmonary neg pulmonary ROS,    Pulmonary exam normal breath sounds clear to auscultation       Cardiovascular negative cardio ROS Normal cardiovascular exam Rhythm:Regular Rate:Normal     Neuro/Psych Seizures -, Well Controlled,  negative psych ROS   GI/Hepatic Neg liver ROS, GERD  ,  Endo/Other  Obesity  Renal/GU negative Renal ROS  negative genitourinary   Musculoskeletal negative musculoskeletal ROS (+)   Abdominal (+) + obese,   Peds  Hematology  (+) anemia ,   Anesthesia Other Findings   Reproductive/Obstetrics (+) Pregnancy Previous C/Section Desires sterilization                           Anesthesia Physical Anesthesia Plan  ASA: II  Anesthesia Plan: Spinal   Post-op Pain Management:    Induction:   PONV Risk Score and Plan: 4 or greater and Scopolamine patch - Pre-op, Dexamethasone and Ondansetron  Airway Management Planned: Natural Airway  Additional Equipment:   Intra-op Plan:   Post-operative Plan:   Informed Consent: I have reviewed the patients History and Physical, chart, labs and discussed the procedure including the risks, benefits and alternatives for the proposed anesthesia with the patient or authorized representative who has indicated his/her understanding and acceptance.   Dental advisory given  Plan Discussed with: CRNA and Surgeon  Anesthesia Plan Comments:        Anesthesia Quick Evaluation

## 2017-11-26 ENCOUNTER — Inpatient Hospital Stay (HOSPITAL_COMMUNITY): Payer: BLUE CROSS/BLUE SHIELD | Admitting: Anesthesiology

## 2017-11-26 ENCOUNTER — Encounter (HOSPITAL_COMMUNITY)
Admission: AD | Disposition: A | Discharge status: Home / Self Care | Payer: Self-pay | Source: Ambulatory Visit | Attending: Obstetrics and Gynecology

## 2017-11-26 ENCOUNTER — Encounter (HOSPITAL_COMMUNITY): Payer: Self-pay | Admitting: *Deleted

## 2017-11-26 ENCOUNTER — Other Ambulatory Visit: Payer: Self-pay

## 2017-11-26 ENCOUNTER — Inpatient Hospital Stay (HOSPITAL_COMMUNITY)
Admission: AD | Admit: 2017-11-26 | Discharge: 2017-11-28 | DRG: 785 | Disposition: A | Discharge status: Home / Self Care | Payer: BLUE CROSS/BLUE SHIELD | Source: Ambulatory Visit | Attending: Obstetrics and Gynecology | Admitting: Obstetrics and Gynecology

## 2017-11-26 DIAGNOSIS — Z880 Allergy status to penicillin: Secondary | ICD-10-CM

## 2017-11-26 DIAGNOSIS — O34211 Maternal care for low transverse scar from previous cesarean delivery: Principal | ICD-10-CM | POA: Diagnosis present

## 2017-11-26 DIAGNOSIS — Z9889 Other specified postprocedural states: Secondary | ICD-10-CM

## 2017-11-26 DIAGNOSIS — Z302 Encounter for sterilization: Secondary | ICD-10-CM

## 2017-11-26 DIAGNOSIS — D649 Anemia, unspecified: Secondary | ICD-10-CM | POA: Diagnosis present

## 2017-11-26 DIAGNOSIS — O99214 Obesity complicating childbirth: Secondary | ICD-10-CM | POA: Diagnosis present

## 2017-11-26 DIAGNOSIS — E669 Obesity, unspecified: Secondary | ICD-10-CM | POA: Diagnosis present

## 2017-11-26 DIAGNOSIS — O9902 Anemia complicating childbirth: Secondary | ICD-10-CM | POA: Diagnosis present

## 2017-11-26 DIAGNOSIS — Z3A39 39 weeks gestation of pregnancy: Secondary | ICD-10-CM | POA: Diagnosis not present

## 2017-11-26 HISTORY — PX: TUBAL LIGATION: SHX77

## 2017-11-26 LAB — RPR: RPR: NONREACTIVE

## 2017-11-26 SURGERY — Surgical Case
Anesthesia: Spinal | Wound class: Clean Contaminated

## 2017-11-26 MED ORDER — CIPROFLOXACIN IN D5W 400 MG/200ML IV SOLN: 400.0000 mg | INTRAVENOUS | Status: DC

## 2017-11-26 MED ORDER — SIMETHICONE 80 MG PO CHEW
80.0000 mg | CHEWABLE_TABLET | Freq: Three times a day (TID) | ORAL | Status: DC
  Administered 2017-11-26 – 2017-11-28 (×5): 80 mg via ORAL
  Filled 2017-11-26 (×5): qty 1

## 2017-11-26 MED ORDER — DIPHENHYDRAMINE HCL 25 MG PO CAPS
25.0000 mg | ORAL_CAPSULE | Freq: Four times a day (QID) | ORAL | Status: DC | PRN
  Administered 2017-11-26: 25 mg via ORAL
  Filled 2017-11-26: qty 1

## 2017-11-26 MED ORDER — DIPHENHYDRAMINE HCL 25 MG PO CAPS: 25.0000 mg | ORAL_CAPSULE | ORAL | Status: DC | PRN

## 2017-11-26 MED ORDER — HYDROMORPHONE HCL 1 MG/ML IJ SOLN
0.5000 mg | Freq: Once | INTRAMUSCULAR | Status: AC
  Administered 2017-11-26: 0.5 mg via INTRAVENOUS
  Filled 2017-11-26: qty 1

## 2017-11-26 MED ORDER — NALBUPHINE HCL 10 MG/ML IJ SOLN: 5.0000 mg | Freq: Once | INTRAMUSCULAR | Status: DC | PRN

## 2017-11-26 MED ORDER — FERROUS SULFATE 325 (65 FE) MG PO TABS
325.0000 mg | ORAL_TABLET | Freq: Two times a day (BID) | ORAL | Status: DC
  Administered 2017-11-26 – 2017-11-28 (×4): 325 mg via ORAL
  Filled 2017-11-26 (×4): qty 1

## 2017-11-26 MED ORDER — SOD CITRATE-CITRIC ACID 500-334 MG/5ML PO SOLN
30.0000 mL | Freq: Once | ORAL | Status: AC
  Administered 2017-11-26: 30 mL via ORAL

## 2017-11-26 MED ORDER — LACTATED RINGERS IV SOLN
INTRAVENOUS | Status: DC
  Administered 2017-11-26 – 2017-11-27 (×2): via INTRAVENOUS

## 2017-11-26 MED ORDER — ACETAMINOPHEN 325 MG PO TABS
650.0000 mg | ORAL_TABLET | ORAL | Status: DC | PRN
  Administered 2017-11-26 – 2017-11-27 (×3): 650 mg via ORAL
  Filled 2017-11-26 (×3): qty 2

## 2017-11-26 MED ORDER — DIPHENHYDRAMINE HCL 50 MG/ML IJ SOLN: 12.5000 mg | INTRAMUSCULAR | Status: DC | PRN

## 2017-11-26 MED ORDER — NALOXONE HCL 4 MG/10ML IJ SOLN
1.0000 ug/kg/h | INTRAVENOUS | Status: DC | PRN
  Filled 2017-11-26: qty 5

## 2017-11-26 MED ORDER — OXYCODONE HCL 5 MG PO TABS
10.0000 mg | ORAL_TABLET | ORAL | Status: DC | PRN
  Administered 2017-11-27 – 2017-11-28 (×5): 10 mg via ORAL
  Filled 2017-11-26 (×4): qty 2

## 2017-11-26 MED ORDER — SIMETHICONE 80 MG PO CHEW
80.0000 mg | CHEWABLE_TABLET | ORAL | Status: DC
  Administered 2017-11-27 – 2017-11-28 (×2): 80 mg via ORAL
  Filled 2017-11-26 (×2): qty 1

## 2017-11-26 MED ORDER — SCOPOLAMINE 1 MG/3DAYS TD PT72
1.0000 | MEDICATED_PATCH | Freq: Once | TRANSDERMAL | Status: DC
  Administered 2017-11-26: 1.5 mg via TRANSDERMAL

## 2017-11-26 MED ORDER — TETANUS-DIPHTH-ACELL PERTUSSIS 5-2.5-18.5 LF-MCG/0.5 IM SUSP: 0.5000 mL | Freq: Once | INTRAMUSCULAR | Status: DC

## 2017-11-26 MED ORDER — ONDANSETRON HCL 4 MG/2ML IJ SOLN
INTRAMUSCULAR | Status: DC | PRN
  Administered 2017-11-26: 4 mg via INTRAVENOUS

## 2017-11-26 MED ORDER — FAMOTIDINE IN NACL 20-0.9 MG/50ML-% IV SOLN
20.0000 mg | Freq: Once | INTRAVENOUS | Status: AC
  Administered 2017-11-26: 20 mg via INTRAVENOUS
  Filled 2017-11-26: qty 50

## 2017-11-26 MED ORDER — OXYTOCIN 10 UNIT/ML IJ SOLN
INTRAVENOUS | Status: DC | PRN
  Administered 2017-11-26: 40 [IU] via INTRAVENOUS

## 2017-11-26 MED ORDER — LACTATED RINGERS IV SOLN
INTRAVENOUS | Status: DC
  Administered 2017-11-26: 11:00:00 via INTRAVENOUS

## 2017-11-26 MED ORDER — ZOLPIDEM TARTRATE 5 MG PO TABS: 5.0000 mg | ORAL_TABLET | Freq: Every evening | ORAL | Status: DC | PRN

## 2017-11-26 MED ORDER — LACTATED RINGERS IV SOLN: INTRAVENOUS | Status: DC | PRN

## 2017-11-26 MED ORDER — OXYCODONE HCL 5 MG PO TABS
5.0000 mg | ORAL_TABLET | ORAL | Status: DC | PRN
  Administered 2017-11-27 (×3): 5 mg via ORAL
  Filled 2017-11-26 (×6): qty 1

## 2017-11-26 MED ORDER — DEXAMETHASONE SODIUM PHOSPHATE 10 MG/ML IJ SOLN
INTRAMUSCULAR | Status: AC
Start: 2017-11-26 — End: ?
  Filled 2017-11-26: qty 1

## 2017-11-26 MED ORDER — METOCLOPRAMIDE HCL 5 MG/ML IJ SOLN: 10.0000 mg | Freq: Once | INTRAMUSCULAR | Status: DC | PRN

## 2017-11-26 MED ORDER — PHENYLEPHRINE 40 MCG/ML (10ML) SYRINGE FOR IV PUSH (FOR BLOOD PRESSURE SUPPORT)
PREFILLED_SYRINGE | INTRAVENOUS | Status: AC
  Filled 2017-11-26: qty 10

## 2017-11-26 MED ORDER — METHYLERGONOVINE MALEATE 0.2 MG/ML IJ SOLN: 0.2000 mg | INTRAMUSCULAR | Status: DC | PRN

## 2017-11-26 MED ORDER — BISACODYL 10 MG RE SUPP: 10.0000 mg | Freq: Every day | RECTAL | Status: DC | PRN

## 2017-11-26 MED ORDER — MEASLES, MUMPS & RUBELLA VAC ~~LOC~~ INJ: 0.5000 mL | INJECTION | Freq: Once | SUBCUTANEOUS | Status: DC

## 2017-11-26 MED ORDER — KETOROLAC TROMETHAMINE 30 MG/ML IJ SOLN
30.0000 mg | Freq: Four times a day (QID) | INTRAMUSCULAR | Status: AC | PRN
  Administered 2017-11-26: 30 mg via INTRAMUSCULAR

## 2017-11-26 MED ORDER — PHENYLEPHRINE HCL 10 MG/ML IJ SOLN
INTRAMUSCULAR | Status: DC | PRN
  Administered 2017-11-26: 120 ug via INTRAVENOUS

## 2017-11-26 MED ORDER — PHENYLEPHRINE 8 MG IN D5W 100 ML (0.08MG/ML) PREMIX OPTIME
INJECTION | INTRAVENOUS | Status: DC | PRN
  Administered 2017-11-26: 60 ug/min via INTRAVENOUS

## 2017-11-26 MED ORDER — METRONIDAZOLE IN NACL 5-0.79 MG/ML-% IV SOLN
500.0000 mg | INTRAVENOUS | Status: DC
Start: 2017-11-26 — End: 2017-11-26

## 2017-11-26 MED ORDER — FENTANYL CITRATE (PF) 100 MCG/2ML IJ SOLN
INTRAMUSCULAR | Status: DC | PRN
  Administered 2017-11-26: 20 ug via INTRATHECAL

## 2017-11-26 MED ORDER — FLEET ENEMA 7-19 GM/118ML RE ENEM: 1.0000 | ENEMA | Freq: Every day | RECTAL | Status: DC | PRN

## 2017-11-26 MED ORDER — PROMETHAZINE HCL 25 MG/ML IJ SOLN
12.5000 mg | Freq: Once | INTRAMUSCULAR | Status: AC
  Administered 2017-11-26: 12.5 mg via INTRAVENOUS
  Filled 2017-11-26: qty 1

## 2017-11-26 MED ORDER — MENTHOL 3 MG MT LOZG: 1.0000 | LOZENGE | OROMUCOSAL | Status: DC | PRN

## 2017-11-26 MED ORDER — DEXAMETHASONE SODIUM PHOSPHATE 10 MG/ML IJ SOLN
INTRAMUSCULAR | Status: DC | PRN
  Administered 2017-11-26: 10 mg via INTRAVENOUS

## 2017-11-26 MED ORDER — SOD CITRATE-CITRIC ACID 500-334 MG/5ML PO SOLN
ORAL | Status: AC
  Filled 2017-11-26: qty 15

## 2017-11-26 MED ORDER — PHENYLEPHRINE 8 MG IN D5W 100 ML (0.08MG/ML) PREMIX OPTIME
INJECTION | INTRAVENOUS | Status: AC
  Filled 2017-11-26: qty 100

## 2017-11-26 MED ORDER — GENTAMICIN SULFATE 40 MG/ML IJ SOLN
Freq: Once | INTRAMUSCULAR | Status: AC
  Administered 2017-11-26: 116.5 mL via INTRAVENOUS
  Filled 2017-11-26: qty 10.5

## 2017-11-26 MED ORDER — ONDANSETRON HCL 4 MG/2ML IJ SOLN
INTRAMUSCULAR | Status: AC
  Filled 2017-11-26: qty 2

## 2017-11-26 MED ORDER — FENTANYL CITRATE (PF) 100 MCG/2ML IJ SOLN
INTRAMUSCULAR | Status: AC
  Filled 2017-11-26: qty 2

## 2017-11-26 MED ORDER — SIMETHICONE 80 MG PO CHEW
80.0000 mg | CHEWABLE_TABLET | ORAL | Status: DC | PRN
Start: 2017-11-26 — End: 2017-11-28

## 2017-11-26 MED ORDER — SCOPOLAMINE 1 MG/3DAYS TD PT72
MEDICATED_PATCH | TRANSDERMAL | Status: AC
  Filled 2017-11-26: qty 1

## 2017-11-26 MED ORDER — IBUPROFEN 800 MG PO TABS
800.0000 mg | ORAL_TABLET | Freq: Three times a day (TID) | ORAL | Status: DC
  Filled 2017-11-26: qty 1

## 2017-11-26 MED ORDER — FENTANYL CITRATE (PF) 100 MCG/2ML IJ SOLN: 25.0000 ug | INTRAMUSCULAR | Status: DC | PRN

## 2017-11-26 MED ORDER — PRENATAL MULTIVITAMIN CH
1.0000 | ORAL_TABLET | Freq: Every day | ORAL | Status: DC
  Administered 2017-11-27: 1 via ORAL
  Filled 2017-11-26: qty 1

## 2017-11-26 MED ORDER — COCONUT OIL OIL: 1.0000 "application " | TOPICAL_OIL | Status: DC | PRN

## 2017-11-26 MED ORDER — MORPHINE SULFATE (PF) 0.5 MG/ML IJ SOLN
INTRAMUSCULAR | Status: AC
  Filled 2017-11-26: qty 10

## 2017-11-26 MED ORDER — NALBUPHINE HCL 10 MG/ML IJ SOLN: 5.0000 mg | INTRAMUSCULAR | Status: DC | PRN

## 2017-11-26 MED ORDER — BUPIVACAINE IN DEXTROSE 0.75-8.25 % IT SOLN
INTRATHECAL | Status: DC | PRN
  Administered 2017-11-26: 12 mg via INTRATHECAL

## 2017-11-26 MED ORDER — METHYLERGONOVINE MALEATE 0.2 MG PO TABS: 0.2000 mg | ORAL_TABLET | ORAL | Status: DC | PRN

## 2017-11-26 MED ORDER — KETOROLAC TROMETHAMINE 30 MG/ML IJ SOLN
INTRAMUSCULAR | Status: AC
  Filled 2017-11-26: qty 1

## 2017-11-26 MED ORDER — DIBUCAINE 1 % RE OINT: 1.0000 "application " | TOPICAL_OINTMENT | RECTAL | Status: DC | PRN

## 2017-11-26 MED ORDER — SENNOSIDES-DOCUSATE SODIUM 8.6-50 MG PO TABS
2.0000 | ORAL_TABLET | ORAL | Status: DC
  Administered 2017-11-27 – 2017-11-28 (×2): 2 via ORAL
  Filled 2017-11-26 (×2): qty 2

## 2017-11-26 MED ORDER — LACTATED RINGERS IV SOLN
INTRAVENOUS | Status: DC
  Administered 2017-11-26 (×2): via INTRAVENOUS

## 2017-11-26 MED ORDER — OXYTOCIN 10 UNIT/ML IJ SOLN
INTRAMUSCULAR | Status: AC
  Filled 2017-11-26: qty 4

## 2017-11-26 MED ORDER — LIDOCAINE HCL 1 % IJ SOLN
INTRAMUSCULAR | Status: AC
  Filled 2017-11-26: qty 20

## 2017-11-26 MED ORDER — WITCH HAZEL-GLYCERIN EX PADS: 1.0000 "application " | MEDICATED_PAD | CUTANEOUS | Status: DC | PRN

## 2017-11-26 MED ORDER — MORPHINE SULFATE (PF) 0.5 MG/ML IJ SOLN
INTRAMUSCULAR | Status: DC | PRN
  Administered 2017-11-26: .2 mg via INTRATHECAL

## 2017-11-26 MED ORDER — KETOROLAC TROMETHAMINE 30 MG/ML IJ SOLN
30.0000 mg | Freq: Four times a day (QID) | INTRAMUSCULAR | Status: AC | PRN
  Administered 2017-11-26 – 2017-11-27 (×2): 30 mg via INTRAVENOUS
  Filled 2017-11-26 (×2): qty 1

## 2017-11-26 MED ORDER — MEPERIDINE HCL 25 MG/ML IJ SOLN: 6.2500 mg | INTRAMUSCULAR | Status: DC | PRN

## 2017-11-26 MED ORDER — OXYTOCIN 40 UNITS IN LACTATED RINGERS INFUSION - SIMPLE MED: 2.5000 [IU]/h | INTRAVENOUS | Status: AC

## 2017-11-26 SURGICAL SUPPLY — 32 items
BENZOIN TINCTURE PRP APPL 2/3 (GAUZE/BANDAGES/DRESSINGS) ×4 IMPLANT
CHLORAPREP W/TINT 26ML (MISCELLANEOUS) ×4 IMPLANT
CLAMP CORD UMBIL (MISCELLANEOUS) IMPLANT
CLIP FILSHIE TUBAL LIGA STRL (Clip) ×4 IMPLANT
CLOSURE WOUND 1/2 X4 (GAUZE/BANDAGES/DRESSINGS) ×1
CLOTH BEACON ORANGE TIMEOUT ST (SAFETY) ×4 IMPLANT
DRSG OPSITE POSTOP 4X10 (GAUZE/BANDAGES/DRESSINGS) ×4 IMPLANT
ELECT REM PT RETURN 9FT ADLT (ELECTROSURGICAL) ×4
ELECTRODE REM PT RTRN 9FT ADLT (ELECTROSURGICAL) ×2 IMPLANT
EXTRACTOR VACUUM BELL STYLE (SUCTIONS) IMPLANT
GLOVE BIO SURGEON STRL SZ7 (GLOVE) ×4 IMPLANT
GLOVE BIOGEL PI IND STRL 7.0 (GLOVE) ×2 IMPLANT
GLOVE BIOGEL PI INDICATOR 7.0 (GLOVE) ×2
GOWN STRL REUS W/TWL LRG LVL3 (GOWN DISPOSABLE) ×8 IMPLANT
KIT ABG SYR 3ML LUER SLIP (SYRINGE) IMPLANT
NEEDLE HYPO 25X5/8 SAFETYGLIDE (NEEDLE) IMPLANT
NS IRRIG 1000ML POUR BTL (IV SOLUTION) ×4 IMPLANT
PACK C SECTION WH (CUSTOM PROCEDURE TRAY) ×4 IMPLANT
PAD OB MATERNITY 4.3X12.25 (PERSONAL CARE ITEMS) ×4 IMPLANT
PENCIL SMOKE EVAC W/HOLSTER (ELECTROSURGICAL) ×4 IMPLANT
RTRCTR C-SECT PINK 25CM LRG (MISCELLANEOUS) ×4 IMPLANT
STRIP CLOSURE SKIN 1/2X4 (GAUZE/BANDAGES/DRESSINGS) ×3 IMPLANT
SUT MNCRL 0 VIOLET CTX 36 (SUTURE) ×4 IMPLANT
SUT MONOCRYL 0 CTX 36 (SUTURE) ×4
SUT PLAIN 2 0 XLH (SUTURE) IMPLANT
SUT VIC AB 0 CT1 27 (SUTURE) ×4
SUT VIC AB 0 CT1 27XBRD ANBCTR (SUTURE) ×4 IMPLANT
SUT VIC AB 2-0 CT1 27 (SUTURE) ×2
SUT VIC AB 2-0 CT1 TAPERPNT 27 (SUTURE) ×2 IMPLANT
SUT VIC AB 4-0 KS 27 (SUTURE) ×4 IMPLANT
TOWEL OR 17X24 6PK STRL BLUE (TOWEL DISPOSABLE) ×4 IMPLANT
TRAY FOLEY BAG SILVER LF 14FR (SET/KITS/TRAYS/PACK) ×4 IMPLANT

## 2017-11-26 NOTE — Progress Notes (Signed)
There has been no change in the patients history, status or exam since the history and physical.  There were no vitals filed for this visit.  Results for orders placed or performed during the hospital encounter of 11/25/17 (from the past 72 hour(s))  CBC     Status: Abnormal   Collection Time: 11/25/17 10:25 AM  Result Value Ref Range   WBC 10.5 4.0 - 10.5 K/uL   RBC 3.97 3.87 - 5.11 MIL/uL   Hemoglobin 11.4 (L) 12.0 - 15.0 g/dL   HCT 34.5 (L) 36.0 - 46.0 %   MCV 86.9 78.0 - 100.0 fL   MCH 28.7 26.0 - 34.0 pg   MCHC 33.0 30.0 - 36.0 g/dL   RDW 15.5 11.5 - 15.5 %   Platelets 251 150 - 400 K/uL    Comment: Performed at [facility], [address] [place], [place] [address]  Type and screen     Status: None (Preliminary result)   Collection Time: 11/25/17 10:25 AM  Result Value Ref Range   ABO/RH(D) B NEG    Antibody Screen POS    Sample Expiration 11/28/2017    Antibody Identification PASSIVELY ACQUIRED ANTI-D    Unit Number [id number]    Blood Component Type RBC LR PHER1    Unit division 00    Status of Unit ALLOCATED    Transfusion Status OK TO TRANSFUSE    Crossmatch Result COMPATIBLE    Unit Number [id number]    Blood Component Type RED CELLS,LR    Unit division 00    Status of Unit REL FROM [facility]    Transfusion Status OK TO TRANSFUSE    Crossmatch Result COMPATIBLE    Unit Number [id number]    Blood Component Type RED CELLS,LR    Unit division 00    Status of Unit ALLOCATED    Transfusion Status OK TO TRANSFUSE    Crossmatch Result COMPATIBLE   RPR     Status: None   Collection Time: 11/25/17 10:25 AM  Result Value Ref Range   RPR Ser Ql Non Reactive Non Reactive    Comment: (NOTE) Performed At: [facility] [address] [place], [place] [id number] Rush Farmer MD YB:[id number] Performed at [facility], [address] [place], [place] [address]     Trice Aspinall A

## 2017-11-26 NOTE — Anesthesia Procedure Notes (Signed)
Spinal  Patient location during procedure: OR Start time: 11/26/2017 10:55 AM Staffing Anesthesiologist: Josephine Igo, MD Performed: anesthesiologist  Preanesthetic Checklist Completed: patient identified, site marked, surgical consent, pre-op evaluation, timeout performed, IV checked, risks and benefits discussed and monitors and equipment checked Spinal Block Patient position: sitting Prep: site prepped and draped and DuraPrep Patient monitoring: heart rate, cardiac monitor, continuous pulse ox and blood pressure Approach: midline Location: L3-4 Injection technique: single-shot Needle Needle type: Sprotte  Needle gauge: 24 G Needle length: 9 cm Needle insertion depth: 6 cm Assessment Sensory level: T4 Additional Notes Patient tolerated procedure well. Adequate sensory level.

## 2017-11-26 NOTE — Anesthesia Postprocedure Evaluation (Signed)
Anesthesia Post Note  Patient: Laura Tran  Procedure(s) Performed: CESAREAN SECTION (N/A ) BILATERAL TUBAL LIGATION (Bilateral )     Patient location during evaluation: Mother Baby Anesthesia Type: Spinal Level of consciousness: awake and alert and oriented Pain management: satisfactory to patient Vital Signs Assessment: post-procedure vital signs reviewed and stable Respiratory status: respiratory function stable and spontaneous breathing Cardiovascular status: blood pressure returned to baseline Postop Assessment: no headache, no backache, spinal receding, patient able to bend at knees and adequate PO intake Anesthetic complications: no    Last Vitals:  Vitals:   11/26/17 1315 11/26/17 1415  BP: 114/70 106/79  Pulse: 69 73  Resp: (!) 29 20  Temp: 36.9 C 36.4 C  SpO2: 98% 98%    Last Pain:  Vitals:   11/26/17 1415  TempSrc: Oral  PainSc:    Pain Goal:                 Kyo Cocuzza

## 2017-11-26 NOTE — Transfer of Care (Signed)
Immediate Anesthesia Transfer of Care Note  Patient: Laura Tran  Procedure(s) Performed: CESAREAN SECTION (N/A ) BILATERAL TUBAL LIGATION (Bilateral )  Patient Location: PACU  Anesthesia Type:Spinal  Level of Consciousness: awake, alert  and oriented  Airway & Oxygen Therapy: Patient Spontanous Breathing  Post-op Assessment: Report given to RN and Post -op Vital signs reviewed and stable  Post vital signs: Reviewed and stable  Last Vitals:  Vitals:   11/26/17 0605  BP: 129/81  Pulse: (!) 102  Temp: 37 C    Last Pain:  Vitals:   11/26/17 0605  TempSrc: Oral         Complications: No apparent anesthesia complications

## 2017-11-26 NOTE — Op Note (Signed)
11/26/2017  11:46 AM  PATIENT:  Laura Tran  32 y.o. female  PRE-OPERATIVE DIAGNOSIS:  REPEAT EDD: 12/03/17 PCN ALLERGY  POST-OPERATIVE DIAGNOSIS:  REPEAT EDD: 12/03/17  PROCEDURE:  Procedure(s) with comments: CESAREAN SECTION (N/A) - Need RNFA BILATERAL TUBAL LIGATION (Bilateral)  SURGEON:  Surgeon(s) and Role:    Bobbye Charleston, MD - Primary  PHYSICIAN ASSISTANT:   ASSISTANTS: RNFA   ANESTHESIA:   spinal  EBL:  1050 mL    SPECIMEN:  No Specimen  DISPOSITION OF SPECIMEN:  N/A  COUNTS:  YES  TOURNIQUET:  * No tourniquets in log *  DICTATION: .Note written in EPIC  PLAN OF CARE: Admit to inpatient   PATIENT DISPOSITION:  PACU - hemodynamically stable.   Delay start of Pharmacological VTE agent (>24hrs) due to surgical blood loss or risk of bleeding: not applicable  Complications:  none Medications:  Ancef, Pitocin Findings:  Baby female, Apgars 9,9, weight P.   Normal tubes, ovaries and uterus seen.  Baby was skin to skin with mother after birth in the OR.  Technique:  After adequate spinal anesthesia was achieved, the patient was prepped and draped in usual sterile fashion.  A foley catheter was used to drain the bladder.  A pfannanstiel incision was made with the scalpel and carried down to the fascia with the bovie cautery. The fascia was incised in the midline with the scalpel and carried in a transverse curvilinear manner bilaterally.  The fascia was reflected superiorly and inferiorly off the rectus muscles and the muscles split in the midline.  A bowel free portion of the peritoneum was entered bluntly and then extended in a superior and inferior manner with good visualization of the bowel and bladder.  The Alexis instrument was then placed and the vesico-uterine fascia tented up and incised in a transverse curvilinear manner.  A 2 cm transverse incision was made in the upper portion of the lower uterine segment until the amnion was exposed.   The  incision was extended transversely in a blunt manner.  Clear fluid was noted and the baby delivered in the vertex presentation without complication.  The baby was bulb suctioned and the cord was clamped and cut aftet stripping blood from cord into baby.  The baby was then handed to awaiting Neonatology.  The placenta was then delivered manually and the uterus cleared of all debris.  The uterine incision was then closed with a running lock stitch of 0 monocryl.  An imbricating layer of 0 monocryl was closed as well. Excellent hemostasis of the uterine incision was achieved and the abdomen was cleared with irrigation.    Attention was turned to the tubes- each isthmic portion bilaterally was tented up with a babcock and then secured with a filschie clip around the entire circumference.  The peritoneum was closed with a running stitch of 2-0 vicryl.  This incorporated the rectus muscles as a separate layer.  The fascia was then closed with a running stitch of 0 vicryl.  The subcutaneous layer was closed with interrupted  stitches of 2-0 plain gut.  The skin was closed with 4-0 vicryl on a Keith needle and steri-strips.  The patient tolerated the procedure well and was returned to the recovery room in stable condition.  All counts were correct times three.  Demichael Traum A

## 2017-11-26 NOTE — Brief Op Note (Signed)
11/26/2017  11:46 AM  PATIENT:  Laura Tran  32 y.o. female  PRE-OPERATIVE DIAGNOSIS:  REPEAT EDD: 12/03/17 PCN ALLERGY  POST-OPERATIVE DIAGNOSIS:  REPEAT EDD: 12/03/17  PROCEDURE:  Procedure(s) with comments: CESAREAN SECTION (N/A) - Need RNFA BILATERAL TUBAL LIGATION (Bilateral)  SURGEON:  Surgeon(s) and Role:    Bobbye Charleston, MD - Primary  PHYSICIAN ASSISTANT:   ASSISTANTS: RNFA   ANESTHESIA:   spinal  EBL:  1050 mL    SPECIMEN:  No Specimen  DISPOSITION OF SPECIMEN:  N/A  COUNTS:  YES  TOURNIQUET:  * No tourniquets in log *  DICTATION: .Note written in EPIC  PLAN OF CARE: Admit to inpatient   PATIENT DISPOSITION:  PACU - hemodynamically stable.   Delay start of Pharmacological VTE agent (>24hrs) due to surgical blood loss or risk of bleeding: not applicable  Complications:  none Medications:  Ancef, Pitocin Findings:  Baby female, Apgars 9,9, weight P.   Normal tubes, ovaries and uterus seen.  Baby was skin to skin with mother after birth in the OR.  Technique:  After adequate spinal anesthesia was achieved, the patient was prepped and draped in usual sterile fashion.  A foley catheter was used to drain the bladder.  A pfannanstiel incision was made with the scalpel and carried down to the fascia with the bovie cautery. The fascia was incised in the midline with the scalpel and carried in a transverse curvilinear manner bilaterally.  The fascia was reflected superiorly and inferiorly off the rectus muscles and the muscles split in the midline.  A bowel free portion of the peritoneum was entered bluntly and then extended in a superior and inferior manner with good visualization of the bowel and bladder.  The Alexis instrument was then placed and the vesico-uterine fascia tented up and incised in a transverse curvilinear manner.  A 2 cm transverse incision was made in the upper portion of the lower uterine segment until the amnion was exposed.   The  incision was extended transversely in a blunt manner.  Clear fluid was noted and the baby delivered in the vertex presentation without complication.  The baby was bulb suctioned and the cord was clamped and cut aftet stripping blood from cord into baby.  The baby was then handed to awaiting Neonatology.  The placenta was then delivered manually and the uterus cleared of all debris.  The uterine incision was then closed with a running lock stitch of 0 monocryl.  An imbricating layer of 0 monocryl was closed as well. Excellent hemostasis of the uterine incision was achieved and the abdomen was cleared with irrigation.  The peritoneum was closed with a running stitch of 2-0 vicryl.  This incorporated the rectus muscles as a separate layer.  The fascia was then closed with a running stitch of 0 vicryl.  The subcutaneous layer was closed with interrupted  stitches of 2-0 plain gut.  The skin was closed with 4-0 vicryl on a Keith needle and steri-strips.  The patient tolerated the procedure well and was returned to the recovery room in stable condition.  All counts were correct times three.  Laura Tran A

## 2017-11-26 NOTE — Addendum Note (Signed)
Addendum  created 11/26/17 1426 by Flossie Dibble, CRNA   Sign clinical note

## 2017-11-26 NOTE — Anesthesia Postprocedure Evaluation (Signed)
Anesthesia Post Note  Patient: Laura Tran  Procedure(s) Performed: CESAREAN SECTION (N/A ) BILATERAL TUBAL LIGATION (Bilateral )     Patient location during evaluation: PACU Anesthesia Type: Spinal Level of consciousness: oriented and awake and alert Pain management: pain level controlled Vital Signs Assessment: post-procedure vital signs reviewed and stable Respiratory status: spontaneous breathing, respiratory function stable and nonlabored ventilation Cardiovascular status: blood pressure returned to baseline and stable Postop Assessment: no headache, no backache, no apparent nausea or vomiting, spinal receding and patient able to bend at knees Anesthetic complications: no    Last Vitals:  Vitals:   11/26/17 1245 11/26/17 1300  BP: 108/82 117/73  Pulse: 90 95  Resp: 17 (!) 24  Temp: 36.9 C   SpO2: 97% 98%    Last Pain:  Vitals:   11/26/17 1245  TempSrc: Oral   Pain Goal:                 Daleysa Kristiansen A.

## 2017-11-27 ENCOUNTER — Encounter (HOSPITAL_COMMUNITY): Payer: Self-pay

## 2017-11-27 LAB — CBC
HCT: 25.9 % — ABNORMAL LOW (ref 36.0–46.0)
Hemoglobin: 8.5 g/dL — ABNORMAL LOW (ref 12.0–15.0)
MCH: 28.4 pg (ref 26.0–34.0)
MCHC: 32.8 g/dL (ref 30.0–36.0)
MCV: 86.6 fL (ref 78.0–100.0)
PLATELETS: 207 10*3/uL (ref 150–400)
RBC: 2.99 MIL/uL — AB (ref 3.87–5.11)
RDW: 15.4 % (ref 11.5–15.5)
WBC: 12.1 10*3/uL — ABNORMAL HIGH (ref 4.0–10.5)

## 2017-11-27 MED ORDER — SODIUM CHLORIDE 0.9% FLUSH: 3.0000 mL | INTRAVENOUS | Status: DC | PRN

## 2017-11-27 MED ORDER — IBUPROFEN 800 MG PO TABS
800.0000 mg | ORAL_TABLET | Freq: Three times a day (TID) | ORAL | Status: DC
  Administered 2017-11-27 – 2017-11-28 (×3): 800 mg via ORAL
  Filled 2017-11-27 (×3): qty 1

## 2017-11-27 MED ORDER — RHO D IMMUNE GLOBULIN 1500 UNIT/2ML IJ SOSY
300.0000 ug | PREFILLED_SYRINGE | Freq: Once | INTRAMUSCULAR | Status: AC
  Administered 2017-11-27: 300 ug via INTRAMUSCULAR
  Filled 2017-11-27: qty 2

## 2017-11-27 MED ORDER — ONDANSETRON HCL 4 MG/2ML IJ SOLN: 4.0000 mg | Freq: Three times a day (TID) | INTRAMUSCULAR | Status: DC | PRN

## 2017-11-27 MED ORDER — NALOXONE HCL 0.4 MG/ML IJ SOLN: 0.4000 mg | INTRAMUSCULAR | Status: DC | PRN

## 2017-11-27 NOTE — Progress Notes (Signed)
  Patient is eating, ambulating, voiding.  Pain control is good.  Vitals:   11/27/17 0100 11/27/17 0200 11/27/17 0300 11/27/17 0428  BP:    98/61  Pulse:    81  Resp:    18  Temp:    98 F (36.7 C)  TempSrc:    Oral  SpO2: 97% 97% 97% 97%  Weight:      Height:        lungs:   clear to auscultation cor:    RRR Abdomen:  soft, appropriate tenderness, incisions intact and without erythema or exudate ex:    no cords   Lab Results  Component Value Date   WBC 12.1 (H) 11/27/2017   HGB 8.5 (L) 11/27/2017   HCT 25.9 (L) 11/27/2017   MCV 86.6 11/27/2017   PLT 207 11/27/2017    --/--/B NEG (02/15 1025)/RI  A/P    Post operative day 1.  Baby B pos- Rhogam today.  Routine post op and postpartum care.  Expect d/c tomorrow.    Iron for anemia.  Percocet for pain control.    Parents desires circumsision.  All risks, benefits and alternatives discussed with the  mother.

## 2017-11-28 LAB — RH IG WORKUP (INCLUDES ABO/RH)
ABO/RH(D): B NEG
FETAL SCREEN: NEGATIVE
GESTATIONAL AGE(WKS): 39
Unit division: 0

## 2017-11-28 MED ORDER — IBUPROFEN 800 MG PO TABS: 800.0000 mg | ORAL_TABLET | Freq: Three times a day (TID) | ORAL | 0 refills | Status: AC

## 2017-11-28 MED ORDER — ONDANSETRON HCL 4 MG PO TABS
8.0000 mg | ORAL_TABLET | Freq: Once | ORAL | Status: AC
  Administered 2017-11-28: 8 mg via ORAL
  Filled 2017-11-28: qty 2

## 2017-11-28 MED ORDER — OXYCODONE HCL 5 MG PO TABS: 5.0000 mg | ORAL_TABLET | Freq: Four times a day (QID) | ORAL | 0 refills | Status: DC | PRN

## 2017-11-28 NOTE — Discharge Summary (Signed)
Obstetric Discharge Summary Reason for Admission: cesarean section Prenatal Procedures: none Intrapartum Procedures: cesarean: low cervical, transverse Postpartum Procedures: Rho(D) Ig Complications-Operative and Postpartum: none Hemoglobin  Date Value Ref Range Status  11/27/2017 8.5 (L) 12.0 - 15.0 g/dL Final    Comment:    DELTA CHECK NOTED REPEATED TO VERIFY    HCT  Date Value Ref Range Status  11/27/2017 25.9 (L) 36.0 - 46.0 % Final    Physical Exam:  General: alert, cooperative and appears stated age 32: appropriate Uterine Fundus: firm Incision: healing well, no significant drainage DVT Evaluation: No evidence of DVT seen on physical exam. Negative Homan's sign.  Discharge Diagnoses: Term Pregnancy-delivered  Discharge Information: Date: 11/28/2017 Activity: pelvic rest Diet: routine Medications: PNV, Ibuprofen and oxycodone Condition: stable Instructions: refer to practice specific booklet Discharge to: home Follow-up Information    Bobbye Charleston, MD Follow up in 4 week(s).   Specialty:  Obstetrics and Gynecology Contact information: [address] [place] [address] [phone]           Newborn Data: Live born female  Birth Weight: 8 lb 13.6 oz (4015 g) APGAR: 35, 41  Newborn Delivery   Birth date/time:  11/26/2017 11:14:00 Delivery type:  C-Section, Low Transverse C-section categorization:  Repeat     Home with mother.  [place] 11/28/2017, 9:51 AM

## 2017-11-28 NOTE — Progress Notes (Signed)
Patient is doing well.  She is tolerating PO, ambulating, voiding.  Pain is controlled.  Lochia is appropriate.  Some gas pain overnight  Vitals:   11/27/17 0428 11/27/17 0740 11/27/17 1803 11/28/17 0541  BP: 98/61  129/80 116/73  Pulse: 81  63 75  Resp: 18  18 18   Temp: 98 F (36.7 C)  (!) 96.5 F (35.8 C) 97.9 F (36.6 C)  TempSrc: Oral  Axillary Oral  SpO2: 97% 95% 100%   Weight:      Height:        NAD Abdomen:  soft, appropriate tenderness, incisions intact and without erythema or drainage ext:    Symmetric, no edema bilaterally  Lab Results  Component Value Date   WBC 12.1 (H) 11/27/2017   HGB 8.5 (L) 11/27/2017   HCT 25.9 (L) 11/27/2017   MCV 86.6 11/27/2017   PLT 207 11/27/2017    --/--/B NEG (02/17 0515)/RI  A/P    32 y.o. [id number] POD 2 s/p RCS Baby Rh positive--s/p rhogam yesterday Meeting all goals--undecided if she wants to go home today

## 2017-11-29 ENCOUNTER — Encounter (HOSPITAL_COMMUNITY): Payer: Self-pay | Admitting: *Deleted

## 2017-11-29 LAB — BPAM RBC
BLOOD PRODUCT EXPIRATION DATE: 201903132359
BLOOD PRODUCT EXPIRATION DATE: 201903222359
Blood Product Expiration Date: 201902252359
Blood Product Expiration Date: 201902252359
Blood Product Expiration Date: 201903092359
Blood Product Expiration Date: 201903192359
Blood Product Expiration Date: 201903202359
ISSUE DATE / TIME: 201902161534
ISSUE DATE / TIME: 201902162318
UNIT TYPE AND RH: 1700
UNIT TYPE AND RH: 9500
UNIT TYPE AND RH: 9500
UNIT TYPE AND RH: 9500
UNIT TYPE AND RH: 9500
Unit Type and Rh: 1700
Unit Type and Rh: 9500

## 2017-11-29 LAB — TYPE AND SCREEN
ABO/RH(D): B NEG
Antibody Screen: POSITIVE
UNIT DIVISION: 0
UNIT DIVISION: 0
UNIT DIVISION: 0
UNIT DIVISION: 0
Unit division: 0
Unit division: 0
Unit division: 0

## 2018-01-26 ENCOUNTER — Telehealth: Payer: Self-pay | Admitting: Diagnostic Neuroimaging

## 2018-01-26 NOTE — Telephone Encounter (Signed)
Pt called requesting an appt when advised Dr. Gladstone Lighter next available appt will be October she requesting to se another provider. Stating she had seen Dr. Leonie Man before in the past and would like to be seen by him again. Pt aware Dr. Leta Baptist is out of the office the rest of the week and next. Please call to advise

## 2018-01-26 NOTE — Telephone Encounter (Signed)
Pt last seen 07/2015.  Has not been on sz meds for last 2 yrs. Has DMV p/w to fill out.  Wants appt.  Made appt for tomorrow at 1030.

## 2018-01-27 ENCOUNTER — Encounter: Payer: Self-pay | Admitting: Nurse Practitioner

## 2018-01-27 ENCOUNTER — Ambulatory Visit: Payer: BLUE CROSS/BLUE SHIELD | Admitting: Nurse Practitioner

## 2018-01-27 VITALS — BP 114/71 | HR 101 | Ht 71.0 in | Wt 172.0 lb

## 2018-01-27 DIAGNOSIS — Z9119 Patient's noncompliance with other medical treatment and regimen: Secondary | ICD-10-CM

## 2018-01-27 DIAGNOSIS — R5601 Complex febrile convulsions: Secondary | ICD-10-CM | POA: Diagnosis not present

## 2018-01-27 DIAGNOSIS — Z91199 Patient's noncompliance with other medical treatment and regimen due to unspecified reason: Secondary | ICD-10-CM | POA: Insufficient documentation

## 2018-01-27 DIAGNOSIS — R569 Unspecified convulsions: Secondary | ICD-10-CM | POA: Diagnosis not present

## 2018-01-27 NOTE — Progress Notes (Signed)
[facility]  PATIENT: Laura Tran DOB: 06-15-1986   REASON FOR VISIT: Follow-up for history of seizure disorder HISTORY FROM: Patient alone at visit    [place] 4/19/2019CM Ms. Mesa, 32 year old female returns for follow-up with history of seizure disorder.  She was last seen in this office October 2016 by Dr. Leta Baptist.  She has a history of breakthrough seizures in the past according to his note.  Second seizure occurred at age 30 and she was started on lamotrigine which caused a rash she was then transferred addition to [place].  She had additional seizure in 2008 when she missed a dose of medication.  She went off of medication in 2009 in 2011 due to pregnancy but returned taking medication around 2011.  Breakthrough seizures in September 2011, August 2012 in January 2014.  Patient came off [place] when she lost her insurance in September 2015.  She remained seizure-free until July 01, 2015.  She had no warning sensations with her seizures.  Has generalized convulsions, loss control of urine she was restarted on seizure medication at that time.  She reports today last seizure 2016.  She has been off her Keppra since February 2018 prior to getting pregnant with her third child.  DMV is now threatening to take her license.  She returns for reevaluation  10/18/16VP74 year old right-handed female here for evaluation of seizure disorder.  Age 18 years old patient was diagnosed with febrile seizure. She was not treated with antiseizure medication.  Second seizure occurred age 63 years old around 2007. At that time she was started on lamotrigine which causes a rash. She was transitioned to levetiracetam. She had additional seizure in 2008 when she missed a dose.  2009 at 2011 patient was off of medication as she was pregnant 2.  Patient had returned to taking levetiracetam around 2011. She had break through seizures in September 2011,  however 2012, August 2012 and January 2014. September 2015 patient came off of medication as she lost her insurance. She remained seizure free until 07/01/2015. Gen. typical seizure. No warning sensation. She had some nervous sensation and feeling, froze, fell to the ground had generalized convulsions. She lost control of urine. No tongue biting. She had some amnesia and confusion afterwards. Patient went to the emergency room and has been restarted on antiseizure medication. She's been taking levetiracetam 500 mg daily instead of twice a day as prescribed.  All of patient's seizures have consisted of generalized convulsions or loss of consciousness. She has never had staring spells. Never had partial onset seizures.   REVIEW OF SYSTEMS: Full 14 system review of systems performed and notable only for those listed, all others are neg:  Constitutional: neg  Cardiovascular: neg Ear/Nose/Throat: neg  Skin: neg Eyes: neg Respiratory: neg Gastroitestinal: neg  Hematology/Lymphatic: neg  Endocrine: neg Musculoskeletal: Neck pain Allergy/Immunology: neg Neurological: History of seizure disorder Psychiatric: neg Sleep : neg   ALLERGIES: Allergies  Allergen Reactions  . Penicillins Other (See Comments)    Has patient had a PCN reaction causing immediate rash, facial/tongue/throat swelling, SOB or lightheadedness with hypotension: Unknown Has patient had a PCN reaction causing severe rash involving mucus membranes or skin necrosis: Unknown Has patient had a PCN reaction that required hospitalization: No Has patient had a PCN reaction occurring within the last 10 years: No Childhood reaction. If all of the above answers are "NO", then may proceed with Cephalosporin use.   [name] Sweet Potato Other (See Comments)  Childhood allergy  . Amoxicillin Rash    Has patient had a PCN reaction causing immediate rash, facial/tongue/throat swelling, SOB or lightheadedness with hypotension: Unknown Has  patient had a PCN reaction causing severe rash involving mucus membranes or skin necrosis: Unknown Has patient had a PCN reaction that required hospitalization: No Has patient had a PCN reaction occurring within the last 10 years: No Childhood reaction. If all of the above answers are "NO", then may proceed with Cephalosporin use.      HOME MEDICATIONS: Outpatient Medications Prior to Visit  Medication Sig Dispense Refill  . acetaminophen (TYLENOL) 325 MG tablet Take 325 mg by mouth 2 (two) times daily as needed (for pain.).    [name] ibuprofen (ADVIL,MOTRIN) 800 MG tablet Take 1 tablet (800 mg total) by mouth 3 (three) times daily. (Patient taking differently: Take 800 mg by mouth every 8 (eight) hours as needed. ) 30 tablet 0  . Prenatal Vit-Fe Fumarate-FA (PRENATAL MULTIVITAMIN) TABS tablet Take 1 tablet by mouth at bedtime.    [name] oxyCODONE (OXY IR/ROXICODONE) 5 MG immediate release tablet Take 1 tablet (5 mg total) by mouth every 6 (six) hours as needed for severe pain. 40 tablet 0   No facility-administered medications prior to visit.     PAST MEDICAL HISTORY: Past Medical History:  Diagnosis Date  . Anemia   . Blood transfusion without reported diagnosis    x4  . Seizures ([place])    last seizure 07/01/15    PAST SURGICAL HISTORY: Past Surgical History:  Procedure Laterality Date  . CESAREAN SECTION     x 2  . CESAREAN SECTION N/A 11/26/2017   Procedure: CESAREAN SECTION;  Surgeon: Bobbye Charleston, MD;  Location: [facility];  Service: Obstetrics;  Laterality: N/A;  Need RNFA  . LAPAROTOMY  01/10/2012   Procedure: EXPLORATORY LAPAROTOMY;  Surgeon: Sharene Butters, MD;  Location: [facility];  Service: Gynecology;  Laterality: N/A;  right ovarian cystectomy; left oophorectomy  . [place]   exploratory  . TUBAL LIGATION Bilateral 11/26/2017   Procedure: BILATERAL TUBAL LIGATION;  Surgeon: Bobbye Charleston, MD;  Location: [facility];  Service: Obstetrics;   Laterality: Bilateral;    FAMILY HISTORY: Family History  Problem Relation Age of Onset  . Healthy Mother   . Healthy Father   . Breast cancer Maternal Grandmother   . Breast cancer Maternal Grandfather   . Seizures Paternal Uncle        febrile    SOCIAL HISTORY: Social History   Socioeconomic History  . Marital status: Married    Spouse name: Vonna Kotyk  . Number of children: 2  . Years of education: 81  . Highest education level: Not on file  Occupational History    Comment: homemaker  Social Needs  . Financial resource strain: Not on file  . Food insecurity:    Worry: Not on file    Inability: Not on file  . Transportation needs:    Medical: Not on file    Non-medical: Not on file  Tobacco Use  . Smoking status: Never Smoker  . Smokeless tobacco: Never Used  Substance and Sexual Activity  . Alcohol use: Yes    Comment: 1-2 glasses wine a week  . Drug use: No  . Sexual activity: Yes    Birth control/protection: IUD  Lifestyle  . Physical activity:    Days per week: Not on file    Minutes per session: Not on file  . Stress: Not  on file  Relationships  . Social connections:    Talks on phone: Not on file    Gets together: Not on file    Attends religious service: Not on file    Active member of club or organization: Not on file    Attends meetings of clubs or organizations: Not on file    Relationship status: Not on file  . Intimate partner violence:    Fear of current or ex partner: Not on file    Emotionally abused: Not on file    Physically abused: Not on file    Forced sexual activity: Not on file  Other Topics Concern  . Not on file  Social History Narrative   Lives at home with husband, children   Caffeine use- sodas 1-2/day, tea 2 x week     PHYSICAL EXAM  Vitals:   01/27/18 1043  BP: 114/71  Pulse: (!) 101  Weight: 172 lb (78 kg)  Height: 5' 11"  (1.803 m)   Body mass index is 23.99 kg/m.  Generalized: Well developed, in no acute  distress  Head: normocephalic and atraumatic,. Oropharynx benign  Neck: Supple, Musculoskeletal: No deformity   Neurological examination   Mentation: Alert oriented to time, place, history taking. Attention span and concentration appropriate. Recent and remote memory intact.  Follows all commands speech and language fluent.   Cranial nerve II-XII: .Pupils were equal round reactive to light extraocular movements were full, visual field were full on confrontational test. Facial sensation and strength were normal. hearing was intact to finger rubbing bilaterally. Uvula tongue midline. head turning and shoulder shrug were normal and symmetric.Tongue protrusion into cheek strength was normal. Motor: normal bulk and tone, full strength in the BUE, BLE, Sensory: normal and symmetric to light touch,  Coordination: finger-nose-finger, heel-to-shin bilaterally, no dysmetria Reflexes: Symmetric upper and lower plantar responses were flexor bilaterally. Gait and Station: Rising up from seated position without assistance, normal stance,  moderate stride, good arm swing, smooth turning, able to perform tiptoe, and heel walking without difficulty. Tandem gait is steady  DIAGNOSTIC DATA (LABS, IMAGING, TESTING) - I reviewed patient records, labs, notes, testing and imaging myself where available.  Lab Results  Component Value Date   WBC 12.1 (H) 11/27/2017   HGB 8.5 (L) 11/27/2017   HCT 25.9 (L) 11/27/2017   MCV 86.6 11/27/2017   PLT 207 11/27/2017      Component Value Date/Time   NA 139 09/11/2008 0540   K 4.2 09/11/2008 0540   CL 105 09/11/2008 0540   CO2 28 09/11/2008 0540   GLUCOSE 85 09/11/2008 0540   BUN 1 (L) 09/11/2008 0540   CREATININE 0.54 09/11/2008 0540   CALCIUM 8.4 09/11/2008 0540   PROT 4.3 (L) 09/08/2008 0942   ALBUMIN 1.3 (L) 09/08/2008 0942   AST 10 09/08/2008 0942   ALT <8 09/08/2008 0942   ALKPHOS 103 09/08/2008 0942   BILITOT 0.4 09/08/2008 0942   GFRNONAA >60  09/11/2008 0540   GFRAA  09/11/2008 0540    >60        The eGFR has been calculated using the MDRD equation. This calculation has not been validated in all clinical        ASSESSMENT AND PLAN  32 y.o. year old female  has a past medical history seizure disorder with last seizure occurring in 2016.  Patient has had breakthrough seizures in the past.  Patient stopped her Keppra and February 2018.  Seizure disorder since the age of 44  which are primary generalized versus complex partial with secondary generalization according to history.  She did not get MRI of the brain at last visit previous have been abnormal foci of gliosis from 2008     PLAN discussed with Dr. Jannifer Franklin He does not think patient should be driving due to her noncompliance in the past and breakthrough seizures in the past.  Last seizure 2016 Stopped Keppra February 2018 Now has DMV forms to be filled out Follow-up as necessary  Dennie Bible, [facility], [facility], [facility] [address], [place], [place] [address] [phone]

## 2018-01-27 NOTE — Patient Instructions (Signed)
Last seizure 2016 Stopped Keppra February 2018 Now has DMV forms to be filled out

## 2018-05-01 DIAGNOSIS — Z0289 Encounter for other administrative examinations: Secondary | ICD-10-CM

## 2018-05-04 ENCOUNTER — Telehealth: Payer: Self-pay | Admitting: *Deleted

## 2018-05-04 DIAGNOSIS — R930 Abnormal findings on diagnostic imaging of skull and head, not elsewhere classified: Secondary | ICD-10-CM

## 2018-05-04 DIAGNOSIS — G40909 Epilepsy, unspecified, not intractable, without status epilepticus: Secondary | ICD-10-CM

## 2018-05-04 NOTE — Telephone Encounter (Signed)
LMVM for pt to return call about her DMV form.

## 2018-05-04 NOTE — Telephone Encounter (Signed)
DMV form received to fill out.  Pt last seen 2016 with Dr. Leta Baptist.  CM/NP saw 01-27-18 for f/u.  Dr. Jannifer Franklin work in [place].  See note.  Placed on Dr. Leta Baptist desk to review.

## 2018-05-08 MED ORDER — LEVETIRACETAM 500 MG PO TABS: 500.0000 mg | ORAL_TABLET | Freq: Two times a day (BID) | ORAL | 3 refills | Status: DC

## 2018-05-08 NOTE — Addendum Note (Signed)
Addended by: Brandon Melnick on: 05/08/2018 01:57 PM   Modules accepted: Orders

## 2018-05-08 NOTE — Telephone Encounter (Signed)
Please get patient back on Keppra as previously ordered.  Will get level of drug in 14 days

## 2018-05-08 NOTE — Telephone Encounter (Signed)
Called pt.  I told her that due to breakthru sz in her history when stopping her seizure medication it is recommended that she take keppra 500mg  po bid as ordered.  I relayed that if she does not follow recommendation than the DMV form will have to be filled out as no driving.  She is ok to start taking this to prevent sz, for her safety, her children and general public.  She has not had a seizure since 2016.  I relayed that will call her when dmv signed and with instructions.

## 2018-05-08 NOTE — Telephone Encounter (Signed)
I LMVM for pt that keppra prescription sent to pharmacy.  CM/NP wants you to have a 2wk keppa level done.  I can fill in that will be having level in 2 wks. Or wait until done and can fill in yes if appropriate.  LMVM for her to return call.

## 2018-05-08 NOTE — Addendum Note (Signed)
Addended by: Otilio Jefferson on: 05/08/2018 02:31 PM   Modules accepted: Orders

## 2018-05-08 NOTE — Telephone Encounter (Signed)
I spoke to pt and relayed that CM/NP wants to to have pt start keppra medication get a level in 2 wks.  Pt wants to have EEG to see if needs to take medication.  She has been sz free since 2016.  I have reiterated that she has seizure disorder, has had breakthru sz when off medication in past.  We feel that she needs to be on medication.  I could not answer question about how long does she have to be off medication to be considered sz free.  I stated that a lot of times pt have EEGs that are normal but still due to there hx and clinical sx need to be on medications.  She has a real problem with the way question is phrased (medically fit).  She feels that taking keppra bid may be too much and if needs to be on it take smaller dose.  Please advise.

## 2018-05-09 NOTE — Addendum Note (Signed)
Addended by: Otilio Jefferson on: 05/09/2018 05:40 PM   Modules accepted: Orders

## 2018-05-09 NOTE — Telephone Encounter (Signed)
Will order MRI of the brain due to previous abnormal MRI

## 2018-05-09 NOTE — Telephone Encounter (Signed)
LMVM for pt that she DMV form copy ready for pickup for we can fax.  The EEG was abnormal when she had it previously so no need to do another one.  Needs MRI follow up since her previous one back in 2008 was not normal and she had continued with seizures 2016.  I reiterated that she will need to take keppra 500mg  po bid, get a 2wk level and then if ok (level therapeutic, no seizures may drive at 3 months. She has had no seizures since 2016. She will need 6 month f/u here.  She wanted to proceed with MRI and then make appt after that.  Fax confirmation received DMV [phone],  [phone].

## 2018-05-11 NOTE — Telephone Encounter (Signed)
MRI authorization was denied, provider will need to call for review, the number is [phone]. Please call before August 5th at 4:00 pm.

## 2018-05-12 NOTE — Telephone Encounter (Signed)
Peer to peer with MD at [facility]. They will not approve MRI of the brain because it has been so long since the abnormal MRI of the brain documented in 2008 and her seizure disorder is not worsening. .Please call the pt to let her know

## 2018-05-12 NOTE — Telephone Encounter (Signed)
Called patient and informed her that the MRI was denied. Advised her the NP did a peer to peer with MD, at [facility] and it was still denied. Advised her it has been too long since MRI in 2008, and her seizure disorder is not getting worse. The patient verbalized understanding.

## 2018-05-15 NOTE — Telephone Encounter (Signed)
Noted, thank you

## 2018-05-25 ENCOUNTER — Telehealth: Payer: Self-pay | Admitting: *Deleted

## 2018-05-25 NOTE — Telephone Encounter (Signed)
LMVM for pt reminder for 2 wk levetircetam level (due around 05-23-18).

## 2018-08-23 ENCOUNTER — Encounter: Payer: Self-pay | Admitting: *Deleted

## 2018-08-23 NOTE — Telephone Encounter (Signed)
Sent letter, checking on pt.  Requested lab after she has been on keppra.

## 2019-06-14 ENCOUNTER — Telehealth: Payer: Self-pay | Admitting: *Deleted

## 2019-06-14 MED ORDER — LEVETIRACETAM 500 MG PO TABS: 500.0000 mg | ORAL_TABLET | Freq: Two times a day (BID) | ORAL | 1 refills | Status: AC

## 2019-06-14 NOTE — Telephone Encounter (Signed)
Received fax request from [facility] to refill levetiracetam. Patient last seen 01/2018, no FU scheduled. I called her and advised she must have a follow up. She stated she is in [place] now, will not return until late Oct. I asked how she would get medicine form [facility]; she stated her grandmother will mail it to her. She agreed to schedule follow up in Oct. I advised her medicine will be refilled enough to get her until her follow up. She  verbalized understanding, appreciation. Refilled x 60 days.

## 2019-08-01 ENCOUNTER — Ambulatory Visit: Payer: Self-pay | Admitting: Diagnostic Neuroimaging

## 2019-08-09 ENCOUNTER — Other Ambulatory Visit: Payer: Self-pay | Admitting: Diagnostic Neuroimaging

## 2020-06-05 ENCOUNTER — Other Ambulatory Visit: Payer: BLUE CROSS/BLUE SHIELD

## 2020-06-05 ENCOUNTER — Other Ambulatory Visit: Payer: Self-pay

## 2020-06-05 DIAGNOSIS — Z20822 Contact with and (suspected) exposure to covid-19: Secondary | ICD-10-CM

## 2020-06-06 LAB — NOVEL CORONAVIRUS, NAA: SARS-CoV-2, NAA: NOT DETECTED

## 2020-06-06 LAB — SARS-COV-2, NAA 2 DAY TAT

## 2020-12-05 DIAGNOSIS — R569 Unspecified convulsions: Secondary | ICD-10-CM | POA: Diagnosis not present

## 2020-12-05 DIAGNOSIS — Z0000 Encounter for general adult medical examination without abnormal findings: Secondary | ICD-10-CM | POA: Diagnosis not present

## 2020-12-05 DIAGNOSIS — Z1331 Encounter for screening for depression: Secondary | ICD-10-CM | POA: Diagnosis not present

## 2020-12-05 DIAGNOSIS — Z6826 Body mass index (BMI) 26.0-26.9, adult: Secondary | ICD-10-CM | POA: Diagnosis not present

## 2020-12-05 DIAGNOSIS — G40909 Epilepsy, unspecified, not intractable, without status epilepticus: Secondary | ICD-10-CM | POA: Diagnosis not present

## 2021-02-18 ENCOUNTER — Other Ambulatory Visit: Payer: Self-pay

## 2021-02-18 ENCOUNTER — Emergency Department (HOSPITAL_COMMUNITY)
Admission: EM | Admit: 2021-02-18 | Discharge: 2021-02-18 | Disposition: A | Discharge status: Home / Self Care | Payer: BC Managed Care – PPO | Attending: Emergency Medicine | Admitting: Emergency Medicine

## 2021-02-18 DIAGNOSIS — R569 Unspecified convulsions: Secondary | ICD-10-CM

## 2021-02-18 DIAGNOSIS — G40909 Epilepsy, unspecified, not intractable, without status epilepticus: Secondary | ICD-10-CM | POA: Diagnosis not present

## 2021-02-18 DIAGNOSIS — R000 Tachycardia, unspecified: Secondary | ICD-10-CM | POA: Diagnosis not present

## 2021-02-18 DIAGNOSIS — R410 Disorientation, unspecified: Secondary | ICD-10-CM | POA: Diagnosis not present

## 2021-02-18 LAB — URINALYSIS, ROUTINE W REFLEX MICROSCOPIC
Bilirubin Urine: NEGATIVE
Glucose, UA: NEGATIVE mg/dL
Ketones, ur: 5 mg/dL — AB
Leukocytes,Ua: NEGATIVE
Nitrite: NEGATIVE
Protein, ur: 100 mg/dL — AB
Specific Gravity, Urine: 1.02 (ref 1.005–1.030)
pH: 5 (ref 5.0–8.0)

## 2021-02-18 LAB — COMPREHENSIVE METABOLIC PANEL
ALT: 9 U/L (ref 0–44)
AST: 17 U/L (ref 15–41)
Albumin: 4.1 g/dL (ref 3.5–5.0)
Alkaline Phosphatase: 65 U/L (ref 38–126)
Anion gap: 8 (ref 5–15)
BUN: 10 mg/dL (ref 6–20)
CO2: 22 mmol/L (ref 22–32)
Calcium: 9 mg/dL (ref 8.9–10.3)
Chloride: 105 mmol/L (ref 98–111)
Creatinine, Ser: 0.81 mg/dL (ref 0.44–1.00)
GFR, Estimated: >60 mL/min (ref >60)
Glucose, Bld: 99 mg/dL (ref 70–99)
Potassium: 3.6 mmol/L (ref 3.5–5.1)
Sodium: 135 mmol/L (ref 135–145)
Total Bilirubin: 0.4 mg/dL (ref 0.3–1.2)
Total Protein: 7.5 g/dL (ref 6.5–8.1)

## 2021-02-18 LAB — CBC WITH DIFFERENTIAL/PLATELET
Abs Immature Granulocytes: 0.06 10*3/uL (ref 0.00–0.07)
Basophils Absolute: 0 10*3/uL (ref 0.0–0.1)
Basophils Relative: 0 %
Eosinophils Absolute: 0 10*3/uL (ref 0.0–0.5)
Eosinophils Relative: 0 %
HCT: 35.8 % — ABNORMAL LOW (ref 36.0–46.0)
Hemoglobin: 11.8 g/dL — ABNORMAL LOW (ref 12.0–15.0)
Immature Granulocytes: 1 %
Lymphocytes Relative: 15 %
Lymphs Abs: 1.6 10*3/uL (ref 0.7–4.0)
MCH: 29.4 pg (ref 26.0–34.0)
MCHC: 33 g/dL (ref 30.0–36.0)
MCV: 89.1 fL (ref 80.0–100.0)
Monocytes Absolute: 0.7 10*3/uL (ref 0.1–1.0)
Monocytes Relative: 7 %
Neutro Abs: 7.8 10*3/uL — ABNORMAL HIGH (ref 1.7–7.7)
Neutrophils Relative %: 77 %
Platelets: 349 10*3/uL (ref 150–400)
RBC: 4.02 MIL/uL (ref 3.87–5.11)
RDW: 13.8 % (ref 11.5–15.5)
WBC: 10.2 10*3/uL (ref 4.0–10.5)
nRBC: 0 % (ref 0.0–0.2)

## 2021-02-18 LAB — RAPID URINE DRUG SCREEN, HOSP PERFORMED
Amphetamines: NOT DETECTED
Barbiturates: NOT DETECTED
Benzodiazepines: NOT DETECTED
Cocaine: NOT DETECTED
Opiates: NOT DETECTED
Tetrahydrocannabinol: POSITIVE — AB

## 2021-02-18 LAB — PREGNANCY, URINE: Preg Test, Ur: NEGATIVE

## 2021-02-18 MED ORDER — LEVETIRACETAM IN NACL 1500 MG/100ML IV SOLN
1500.0000 mg | Freq: Once | INTRAVENOUS | Status: AC
  Administered 2021-02-18: 1500 mg via INTRAVENOUS
  Filled 2021-02-18: qty 100

## 2021-02-18 MED ORDER — ONDANSETRON HCL 4 MG/2ML IJ SOLN
4.0000 mg | Freq: Once | INTRAMUSCULAR | Status: AC
  Administered 2021-02-18: 4 mg via INTRAVENOUS
  Filled 2021-02-18: qty 2

## 2021-02-18 MED ORDER — KETOROLAC TROMETHAMINE 30 MG/ML IJ SOLN
15.0000 mg | Freq: Once | INTRAMUSCULAR | Status: AC
  Administered 2021-02-18: 15 mg via INTRAVENOUS
  Filled 2021-02-18: qty 1

## 2021-02-18 MED ORDER — SODIUM CHLORIDE 0.9 % IV BOLUS
1000.0000 mL | Freq: Once | INTRAVENOUS | Status: AC
  Administered 2021-02-18: 1000 mL via INTRAVENOUS

## 2021-02-18 NOTE — ED Triage Notes (Signed)
Per patient she was mowing the yard and had a seizure at some point. Patient was found via police in a bystander's yard and patient did not remember where she was going or what she was doing. Patient has a a hx of seizures and has not had one in 2 years. Patient is on keppra but states she has been slack taking her medication this week due to stress.

## 2021-02-18 NOTE — Discharge Instructions (Addendum)
Follow-up with your neurologist or primary care doctor for clearance to resume driving.  Do not drive climb ladders, bathe alone or swim until seen and cleared to return to these activities.

## 2021-02-18 NOTE — ED Notes (Signed)
Patient repositioned in bed and provided with sheet.
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
## 2021-10-23 ENCOUNTER — Other Ambulatory Visit: Payer: Self-pay

## 2021-10-23 ENCOUNTER — Encounter (HOSPITAL_COMMUNITY): Payer: Self-pay | Admitting: Emergency Medicine

## 2021-10-23 ENCOUNTER — Emergency Department (HOSPITAL_COMMUNITY)
Admission: EM | Admit: 2021-10-23 | Discharge: 2021-10-23 | Disposition: A | Discharge status: Home / Self Care | Payer: Medicaid Other | Attending: Emergency Medicine | Admitting: Emergency Medicine

## 2021-10-23 DIAGNOSIS — T148XXA Other injury of unspecified body region, initial encounter: Secondary | ICD-10-CM

## 2021-10-23 DIAGNOSIS — W01198A Fall on same level from slipping, tripping and stumbling with subsequent striking against other object, initial encounter: Secondary | ICD-10-CM | POA: Diagnosis not present

## 2021-10-23 DIAGNOSIS — S0083XA Contusion of other part of head, initial encounter: Secondary | ICD-10-CM | POA: Insufficient documentation

## 2021-10-23 DIAGNOSIS — R569 Unspecified convulsions: Secondary | ICD-10-CM | POA: Insufficient documentation

## 2021-10-23 DIAGNOSIS — S0990XA Unspecified injury of head, initial encounter: Secondary | ICD-10-CM | POA: Diagnosis present

## 2021-10-23 DIAGNOSIS — Y990 Civilian activity done for income or pay: Secondary | ICD-10-CM | POA: Diagnosis not present

## 2021-10-23 LAB — COMPREHENSIVE METABOLIC PANEL
ALT: 11 U/L (ref 0–44)
AST: 16 U/L (ref 15–41)
Albumin: 4.4 g/dL (ref 3.5–5.0)
Alkaline Phosphatase: 66 U/L (ref 38–126)
Anion gap: 7 (ref 5–15)
BUN: 10 mg/dL (ref 6–20)
CO2: 24 mmol/L (ref 22–32)
Calcium: 9.3 mg/dL (ref 8.9–10.3)
Chloride: 102 mmol/L (ref 98–111)
Creatinine, Ser: 0.72 mg/dL (ref 0.44–1.00)
GFR, Estimated: >60 mL/min (ref >60)
Glucose, Bld: 98 mg/dL (ref 70–99)
Potassium: 4 mmol/L (ref 3.5–5.1)
Sodium: 133 mmol/L — ABNORMAL LOW (ref 135–145)
Total Bilirubin: 0.3 mg/dL (ref 0.3–1.2)
Total Protein: 7.8 g/dL (ref 6.5–8.1)

## 2021-10-23 LAB — CBC WITH DIFFERENTIAL/PLATELET
Abs Immature Granulocytes: 0.03 10*3/uL (ref 0.00–0.07)
Basophils Absolute: 0 10*3/uL (ref 0.0–0.1)
Basophils Relative: 0 %
Eosinophils Absolute: 0 10*3/uL (ref 0.0–0.5)
Eosinophils Relative: 0 %
HCT: 36.2 % (ref 36.0–46.0)
Hemoglobin: 12 g/dL (ref 12.0–15.0)
Immature Granulocytes: 0 %
Lymphocytes Relative: 11 %
Lymphs Abs: 1 10*3/uL (ref 0.7–4.0)
MCH: 30.1 pg (ref 26.0–34.0)
MCHC: 33.1 g/dL (ref 30.0–36.0)
MCV: 90.7 fL (ref 80.0–100.0)
Monocytes Absolute: 0.4 10*3/uL (ref 0.1–1.0)
Monocytes Relative: 5 %
Neutro Abs: 7.3 10*3/uL (ref 1.7–7.7)
Neutrophils Relative %: 84 %
Platelets: 332 10*3/uL (ref 150–400)
RBC: 3.99 MIL/uL (ref 3.87–5.11)
RDW: 13.6 % (ref 11.5–15.5)
WBC: 8.8 10*3/uL (ref 4.0–10.5)
nRBC: 0 % (ref 0.0–0.2)

## 2021-10-23 LAB — MAGNESIUM: Magnesium: 2.1 mg/dL (ref 1.7–2.4)

## 2021-10-23 LAB — CBG MONITORING, ED: Glucose-Capillary: 84 mg/dL (ref 70–99)

## 2021-10-23 MED ORDER — LEVETIRACETAM IN NACL 1000 MG/100ML IV SOLN
1000.0000 mg | Freq: Once | INTRAVENOUS | Status: AC
  Administered 2021-10-23: 1000 mg via INTRAVENOUS
  Filled 2021-10-23: qty 100

## 2021-10-23 MED ORDER — ACETAMINOPHEN 325 MG PO TABS: 650.0000 mg | ORAL_TABLET | Freq: Once | ORAL | Status: DC

## 2021-10-23 MED ORDER — FENTANYL CITRATE PF 50 MCG/ML IJ SOSY
50.0000 ug | PREFILLED_SYRINGE | Freq: Once | INTRAMUSCULAR | Status: AC
  Administered 2021-10-23: 50 ug via INTRAVENOUS
  Filled 2021-10-23: qty 1

## 2021-10-23 MED ORDER — ONDANSETRON HCL 4 MG/2ML IJ SOLN
4.0000 mg | Freq: Once | INTRAMUSCULAR | Status: AC
Start: 2021-10-23 — End: 2021-10-23
  Administered 2021-10-23: 4 mg via INTRAVENOUS
  Filled 2021-10-23: qty 2

## 2021-10-23 NOTE — ED Triage Notes (Signed)
Pt arrived via [employer] w c/o seizure. Stood up from her chair at work and stated she had a seizure and fell and hit her head. Has a Hx of seizures

## 2021-10-23 NOTE — ED Provider Notes (Signed)
[facility] Provider Note   CSN: [id number] Arrival date & time: 10/23/21  1138     History  No chief complaint on file.   Laura Tran is a 36 y.o. female.who  has a past medical history of Anemia, Blood transfusion without reported diagnosis, and Seizures ([place]).  Patient has a longstanding history of epilepsy.  She is on Keppra.  Patient states that she is compliant with her medication but did miss 1 dose this morning.  She states that she does not think that that would have triggered the apparent seizure she had at work today.  Patient states she usually gets about 2 seizures a year.  The last one she had was approximately 4 months ago.  She did hit her head and has a large hematoma to the right occiput.  Patient did have mild urinary incontinence.  She did not bite her tongue.  She can think of no other aggravating circumstances to cause her seizure today.  She denies any nausea, vomiting, changes in vision, auras prior to her seizure.  She does have a headache.  She has no other injuries.   HPI     Home Medications Prior to Admission medications   Medication Sig Start Date End Date Taking? Authorizing Provider  levETIRAcetam (KEPPRA) 500 MG tablet Take 1 tablet (500 mg total) by mouth 2 (two) times daily. Patient taking differently: Take 500 mg by mouth daily. 06/14/19  Yes Penumalli, Earlean Polka, MD  acetaminophen (TYLENOL) 325 MG tablet Take 325 mg by mouth 2 (two) times daily as needed (for pain.).    [provider]  ibuprofen (ADVIL,MOTRIN) 800 MG tablet Take 1 tablet (800 mg total) by mouth 3 (three) times daily. Patient not taking: Reported on 02/18/2021 11/28/17   Jerelyn Charles, MD      Allergies    Penicillins, Sweet potato, and Amoxicillin    Review of Systems   Review of Systems As per HPI Physical Exam Updated Vital Signs BP 116/78    Pulse 76    Temp 98.3 F (36.8 C) (Oral)    Resp 16    Ht 5\' 5"  (1.651 m)    Wt 68 kg    SpO2 98%     BMI 24.96 kg/m  Physical Exam Vitals and nursing note reviewed.  Constitutional:      General: She is not in acute distress.    Appearance: She is well-developed. She is not diaphoretic.  HENT:     Head: Normocephalic and atraumatic.     Nose: Nose normal.     Mouth/Throat:     Mouth: Mucous membranes are moist.  Eyes:     General: No scleral icterus.    Conjunctiva/sclera: Conjunctivae normal.     Pupils: Pupils are equal, round, and reactive to light.     Comments: No horizontal, vertical or rotational nystagmus  Cardiovascular:     Rate and Rhythm: Normal rate and regular rhythm.  Pulmonary:     Effort: Pulmonary effort is normal. No respiratory distress.     Breath sounds: No wheezing or rales.  Abdominal:     General: There is no distension.     Palpations: Abdomen is soft.     Tenderness: There is no abdominal tenderness. There is no guarding or rebound.  Musculoskeletal:        General: Normal range of motion.     Cervical back: Normal range of motion and neck supple.  Lymphadenopathy:     Cervical:  No cervical adenopathy.  Skin:    General: Skin is warm and dry.     Findings: No rash.  Neurological:     Mental Status: She is alert and oriented to person, place, and time.     Cranial Nerves: No cranial nerve deficit.     Motor: No abnormal muscle tone.     Coordination: Coordination normal.     Comments: Mental Status:  Alert, oriented, thought content appropriate. Speech fluent without evidence of aphasia. Able to follow 2 step commands without difficulty.  Cranial Nerves:  II:  Peripheral visual fields grossly normal, pupils equal, round, reactive to light III,IV, VI: ptosis not present, extra-ocular motions intact bilaterally  V,VII: smile symmetric, facial light touch sensation equal VIII: hearing grossly normal bilaterally  IX,X: midline uvula rise  XI: bilateral shoulder shrug equal and strong XII: midline tongue extension  Motor:  5/5 in upper and lower  extremities bilaterally including strong and equal grip strength and dorsiflexion/plantar flexion Sensory:  light touch normal in all extremities.  Cerebellar: normal finger-to-nose with bilateral upper extremities Gait: normal gait and balance CV: distal pulses palpable throughout   Psychiatric:        Mood and Affect: Mood normal.        Behavior: Behavior normal.        Thought Content: Thought content normal.        Judgment: Judgment normal.    ED Results / Procedures / Treatments   Labs (all labs ordered are listed, but only abnormal results are displayed) Labs Reviewed  COMPREHENSIVE METABOLIC PANEL - Abnormal; Notable for the following components:      Result Value   Sodium 133 (*)    All other components within normal limits  CBC WITH DIFFERENTIAL/PLATELET  MAGNESIUM  CBG MONITORING, ED  POC URINE PREG, ED    EKG None  Radiology No results found.  Procedures Procedures    Medications Ordered in ED Medications  acetaminophen (TYLENOL) tablet 650 mg (0 mg Oral Hold 10/23/21 1306)  levETIRAcetam (KEPPRA) IVPB 1000 mg/100 mL premix (0 mg Intravenous Stopped 10/23/21 1258)  fentaNYL (SUBLIMAZE) injection 50 mcg (50 mcg Intravenous Given 10/23/21 1254)  ondansetron (ZOFRAN) injection 4 mg (4 mg Intravenous Given 10/23/21 1254)    ED Course/ Medical Decision Making/ A&P Clinical Course as of 10/23/21 1933  Fri Oct 23, 2021  1250 Magnesium: 2.1 [AH]  1250 Sodium(!): 133 [AH]    Clinical Course User Index [AH] Margarita Mail, PA-C                           Medical Decision Making Patient here with seizure, known history of epilepsy. The differential diagnosis for includes but is not limited to idiopathic seizure, traumatic brain injury, intracranial hemorrhage, vascular lesion, mass or space containing lesion, degenerative neurologic disease, congenital brain abnormality, infectious etiology such as meningitis, encephalitis or abscess, metabolic disturbance  including hyper or hypoglycemia, hyper or hyponatremia, hyperosmolar state, uremia, hepatic failure, hypocalcemia, hypomagnesemia.  Toxic substances such as cocaine, lidocaine, antidepressants, theophylline, alcohol withdrawal, drug withdrawal, eclampsia, hypertensive encephalopathy and anoxic brain injury. I ordered and reviewed labs as discussed in the ED course.  Patient has a hematoma on the head but has no neurologic deficits and was at baseline upon arrival in the emergency department.  She does have a large hematoma.  Pain treated in the ED.  She did develop some mild nausea but had no vomiting or changes in her  mental status.  Patient appears appropriate for discharge after Keppra loading dose.  Discussed outpatient follow-up with neurology   Amount and/or Complexity of Data Reviewed Labs: ordered. Decision-making details documented in ED Course.    Final Clinical Impression(s) / ED Diagnoses Final diagnoses:  Seizure [facility])  Hematoma    Rx / DC Orders ED Discharge Orders     None         Margarita Mail, PA-C 10/23/21 1934    Milton Ferguson, MD 10/24/21 1208

## 2021-10-23 NOTE — Discharge Instructions (Signed)
Get help right away if: °You have: °A seizure that does not stop after 5 minutes. °Several seizures in a row without a complete recovery between seizures. °A seizure that makes it harder to breathe. °A seizure that leaves you unable to speak or use a part of your body. °You do not wake up right away after a seizure. °You injure yourself during a seizure. °You have confusion or pain right after a seizure. °

## 2021-12-15 DIAGNOSIS — Z1331 Encounter for screening for depression: Secondary | ICD-10-CM | POA: Diagnosis not present

## 2021-12-15 DIAGNOSIS — Z0000 Encounter for general adult medical examination without abnormal findings: Secondary | ICD-10-CM | POA: Diagnosis not present

## 2021-12-15 DIAGNOSIS — E663 Overweight: Secondary | ICD-10-CM | POA: Diagnosis not present

## 2021-12-15 DIAGNOSIS — Z6824 Body mass index (BMI) 24.0-24.9, adult: Secondary | ICD-10-CM | POA: Diagnosis not present

## 2022-03-22 ENCOUNTER — Ambulatory Visit
Admission: EM | Admit: 2022-03-22 | Discharge: 2022-03-22 | Disposition: A | Discharge status: Home / Self Care | Payer: Medicaid Other | Attending: Family Medicine | Admitting: Family Medicine

## 2022-03-22 DIAGNOSIS — S91311A Laceration without foreign body, right foot, initial encounter: Secondary | ICD-10-CM

## 2022-03-22 DIAGNOSIS — Z23 Encounter for immunization: Secondary | ICD-10-CM

## 2022-03-22 MED ORDER — TETANUS-DIPHTH-ACELL PERTUSSIS 5-2.5-18.5 LF-MCG/0.5 IM SUSY
0.5000 mL | PREFILLED_SYRINGE | Freq: Once | INTRAMUSCULAR | Status: AC
  Administered 2022-03-22: 0.5 mL via INTRAMUSCULAR

## 2022-03-22 MED ORDER — SULFAMETHOXAZOLE-TRIMETHOPRIM 800-160 MG PO TABS
1.0000 | ORAL_TABLET | Freq: Two times a day (BID) | ORAL | 0 refills | Status: AC
Start: 2022-03-22 — End: 2022-03-29

## 2022-03-22 NOTE — ED Provider Notes (Signed)
[facility]    CSN: [id number] Arrival date & time: 03/22/22  1747      History   Chief Complaint Chief Complaint  Patient presents with   Laceration    HPI Laura Tran is a 36 y.o. female.   Patient presenting today with a large laceration to the bottom of right foot when a nail scraped across the area 3 days ago.  She states she has been cleaning the area with peroxide, using Neosporin and a bandage in the area has been healing fairly well so far.  Very sore with weightbearing, no decreased range of motion, numbness, tingling, swelling, redness, drainage, fever.  Requesting her tetanus shot to be updated as she is unsure when her last one was but thinks it was more than 10 years ago.    Past Medical History:  Diagnosis Date   Anemia    Blood transfusion without reported diagnosis    x4   Seizures ([place])    last seizure 07/01/15    Patient Active Problem List   Diagnosis Date Noted   Generalized seizure ([place]) 01/27/2018   History of noncompliance with medical treatment 01/27/2018   Postoperative state 11/26/2017   Convulsions/seizures ([place]) 06/22/2013    Past Surgical History:  Procedure Laterality Date   CESAREAN SECTION     x 2   CESAREAN SECTION N/A 11/26/2017   Procedure: CESAREAN SECTION;  Surgeon: Bobbye Charleston, MD;  Location: [place];  Service: Obstetrics;  Laterality: N/A;  Need RNFA   LAPAROTOMY  01/10/2012   Procedure: EXPLORATORY LAPAROTOMY;  Surgeon: Sharene Butters, MD;  Location: [facility];  Service: Gynecology;  Laterality: N/A;  right ovarian cystectomy; left oophorectomy   NECK SURGERY  1997   exploratory   TUBAL LIGATION Bilateral 11/26/2017   Procedure: BILATERAL TUBAL LIGATION;  Surgeon: Bobbye Charleston, MD;  Location: [place];  Service: Obstetrics;  Laterality: Bilateral;    OB History     Gravida  3   Para  3   Term  3   Preterm      AB      Living  3      SAB      IAB      Ectopic       Multiple  0   Live Births  3            Home Medications    Prior to Admission medications   Medication Sig Start Date End Date Taking? Authorizing Provider  sulfamethoxazole-trimethoprim (BACTRIM DS) 800-160 MG tablet Take 1 tablet by mouth 2 (two) times daily for 7 days. 03/22/22 03/29/22 Yes Volney American, PA-C  acetaminophen (TYLENOL) 325 MG tablet Take 325 mg by mouth 2 (two) times daily as needed (for pain.).    [provider]  ibuprofen (ADVIL,MOTRIN) 800 MG tablet Take 1 tablet (800 mg total) by mouth 3 (three) times daily. Patient not taking: Reported on 02/18/2021 11/28/17   Jerelyn Charles, MD  levETIRAcetam (KEPPRA) 500 MG tablet Take 1 tablet (500 mg total) by mouth 2 (two) times daily. Patient taking differently: Take 500 mg by mouth daily. 06/14/19   Penumalli, Earlean Polka, MD    Family History Family History  Problem Relation Age of Onset   Healthy Mother    Healthy Father    Breast cancer Maternal Grandmother    Breast cancer Maternal Grandfather    Seizures Paternal Uncle        febrile  Social History Social History   Tobacco Use   Smoking status: Never   Smokeless tobacco: Never  Substance Use Topics   Alcohol use: Yes    Comment: 1-2 glasses wine a week   Drug use: No     Allergies   Penicillins, Sweet potato, and Amoxicillin   Review of Systems Review of Systems Per HPI  Physical Exam Triage Vital Signs ED Triage Vitals  Enc Vitals Group     BP 03/22/22 1856 124/85     Pulse Rate 03/22/22 1856 82     Resp 03/22/22 1856 20     Temp 03/22/22 1856 98.8 F (37.1 C)     Temp src --      SpO2 03/22/22 1856 98 %     Weight --      Height --      Head Circumference --      Peak Flow --      Pain Score 03/22/22 1854 3     Pain Loc --      Pain Edu? --      Excl. in [place]? --    No data found.  Updated Vital Signs BP 124/85   Pulse 82   Temp 98.8 F (37.1 C)   Resp 20   LMP 02/27/2022   SpO2 98%   Visual  Acuity Right Eye Distance:   Left Eye Distance:   Bilateral Distance:    Right Eye Near:   Left Eye Near:    Bilateral Near:     Physical Exam Vitals and nursing note reviewed.  Constitutional:      Appearance: Normal appearance. She is not ill-appearing.  HENT:     Head: Atraumatic.     Mouth/Throat:     Mouth: Mucous membranes are moist.  Eyes:     Extraocular Movements: Extraocular movements intact.     Conjunctiva/sclera: Conjunctivae normal.  Cardiovascular:     Rate and Rhythm: Normal rate and regular rhythm.     Heart sounds: Normal heart sounds.  Pulmonary:     Effort: Pulmonary effort is normal.     Breath sounds: Normal breath sounds.  Musculoskeletal:        General: Normal range of motion.     Cervical back: Normal range of motion and neck supple.  Skin:    General: Skin is warm and dry.     Comments: Skin avulsion to lateral plantar surface of right foot below the base of little toe.  No surrounding erythema, edema, drainage, bleeding  Neurological:     Mental Status: She is alert and oriented to person, place, and time.     Comments: Right lower extremity neurovascularly intact  Psychiatric:        Mood and Affect: Mood normal.        Thought Content: Thought content normal.        Judgment: Judgment normal.      UC Treatments / Results  Labs (all labs ordered are listed, but only abnormal results are displayed) Labs Reviewed - No data to display  EKG   Radiology No results found.  Procedures Procedures (including critical care time)  Medications Ordered in UC Medications  Tdap (BOOSTRIX) injection 0.5 mL (0.5 mLs Intramuscular Given 03/22/22 1915)    Initial Impression / Assessment and Plan / UC Course  I have reviewed the triage vital signs and the nursing notes.  Pertinent labs & imaging results that were available during my care of the patient were reviewed  by me and considered in my medical decision making (see chart for details).      Given location and size of the wound, will place on Bactrim to prevent infection.  Continue good home wound care.  Tetanus updated today.  Return for worsening symptoms.  Final Clinical Impressions(s) / UC Diagnoses   Final diagnoses:  Foot laceration, right, initial encounter   Discharge Instructions   None    ED Prescriptions     Medication Sig Dispense Auth. Provider   sulfamethoxazole-trimethoprim (BACTRIM DS) 800-160 MG tablet Take 1 tablet by mouth 2 (two) times daily for 7 days. 14 tablet Volney American, [place]      PDMP not reviewed this encounter.   Volney American, [place] 03/22/22 1922

## 2022-03-22 NOTE — ED Triage Notes (Signed)
Pt stepped on nail Friday morning, needs tetanus updated

## 2022-10-19 DIAGNOSIS — Z124 Encounter for screening for malignant neoplasm of cervix: Secondary | ICD-10-CM | POA: Diagnosis not present

## 2022-10-19 DIAGNOSIS — Z113 Encounter for screening for infections with a predominantly sexual mode of transmission: Secondary | ICD-10-CM | POA: Diagnosis not present

## 2022-10-19 DIAGNOSIS — Z6824 Body mass index (BMI) 24.0-24.9, adult: Secondary | ICD-10-CM | POA: Diagnosis not present

## 2022-10-19 DIAGNOSIS — Z01419 Encounter for gynecological examination (general) (routine) without abnormal findings: Secondary | ICD-10-CM | POA: Diagnosis not present

## 2022-10-19 DIAGNOSIS — Z3202 Encounter for pregnancy test, result negative: Secondary | ICD-10-CM | POA: Diagnosis not present

## 2022-10-25 DIAGNOSIS — Z6824 Body mass index (BMI) 24.0-24.9, adult: Secondary | ICD-10-CM | POA: Diagnosis not present

## 2022-10-25 DIAGNOSIS — D259 Leiomyoma of uterus, unspecified: Secondary | ICD-10-CM | POA: Diagnosis not present

## 2022-10-25 DIAGNOSIS — N946 Dysmenorrhea, unspecified: Secondary | ICD-10-CM | POA: Diagnosis not present

## 2023-03-30 DIAGNOSIS — Z6823 Body mass index (BMI) 23.0-23.9, adult: Secondary | ICD-10-CM | POA: Diagnosis not present

## 2023-03-30 DIAGNOSIS — G40909 Epilepsy, unspecified, not intractable, without status epilepticus: Secondary | ICD-10-CM | POA: Diagnosis not present

## 2023-03-30 DIAGNOSIS — Z0000 Encounter for general adult medical examination without abnormal findings: Secondary | ICD-10-CM | POA: Diagnosis not present

## 2023-04-01 DIAGNOSIS — H00015 Hordeolum externum left lower eyelid: Secondary | ICD-10-CM | POA: Diagnosis not present

## 2023-04-01 DIAGNOSIS — Z6823 Body mass index (BMI) 23.0-23.9, adult: Secondary | ICD-10-CM | POA: Diagnosis not present

## 2023-08-08 DIAGNOSIS — M9902 Segmental and somatic dysfunction of thoracic region: Secondary | ICD-10-CM | POA: Diagnosis not present

## 2023-08-08 DIAGNOSIS — M5384 Other specified dorsopathies, thoracic region: Secondary | ICD-10-CM | POA: Diagnosis not present

## 2023-08-08 DIAGNOSIS — M9901 Segmental and somatic dysfunction of cervical region: Secondary | ICD-10-CM | POA: Diagnosis not present

## 2023-08-08 DIAGNOSIS — M50122 Cervical disc disorder at C5-C6 level with radiculopathy: Secondary | ICD-10-CM | POA: Diagnosis not present

## 2023-11-18 ENCOUNTER — Ambulatory Visit: Payer: Medicaid Other

## 2024-06-01 DIAGNOSIS — E611 Iron deficiency: Secondary | ICD-10-CM | POA: Diagnosis not present

## 2024-06-01 DIAGNOSIS — Z0000 Encounter for general adult medical examination without abnormal findings: Secondary | ICD-10-CM | POA: Diagnosis not present

## 2024-06-01 DIAGNOSIS — R790 Abnormal level of blood mineral: Secondary | ICD-10-CM | POA: Diagnosis not present

## 2024-06-01 DIAGNOSIS — Z8349 Family history of other endocrine, nutritional and metabolic diseases: Secondary | ICD-10-CM | POA: Diagnosis not present

## 2024-06-01 DIAGNOSIS — G40909 Epilepsy, unspecified, not intractable, without status epilepticus: Secondary | ICD-10-CM | POA: Diagnosis not present
